# Patient Record
Sex: Female | Born: 1940
Health system: Southern US, Community
[De-identification: ages and names within clinical notes are randomized; demographics above are authoritative.]

## PROBLEM LIST (undated history)

## (undated) DIAGNOSIS — M1990 Unspecified osteoarthritis, unspecified site: Secondary | ICD-10-CM

## (undated) DIAGNOSIS — Z5189 Encounter for other specified aftercare: Secondary | ICD-10-CM

## (undated) DIAGNOSIS — E785 Hyperlipidemia, unspecified: Secondary | ICD-10-CM

## (undated) DIAGNOSIS — I10 Essential (primary) hypertension: Secondary | ICD-10-CM

## (undated) DIAGNOSIS — K219 Gastro-esophageal reflux disease without esophagitis: Secondary | ICD-10-CM

## (undated) DIAGNOSIS — E079 Disorder of thyroid, unspecified: Secondary | ICD-10-CM

## (undated) DIAGNOSIS — J189 Pneumonia, unspecified organism: Secondary | ICD-10-CM

## (undated) HISTORY — PX: LYMPH NODE DISSECTION: SHX5087

## (undated) HISTORY — PX: APPENDECTOMY: SHX54

## (undated) HISTORY — DX: Encounter for other specified aftercare: Z51.89

## (undated) HISTORY — PX: TONSILLECTOMY: SUR1361

## (undated) HISTORY — DX: Hyperlipidemia, unspecified: E78.5

## (undated) HISTORY — PX: CATARACT EXTRACTION, BILATERAL: SHX1313

## (undated) HISTORY — PX: OTHER SURGICAL HISTORY: SHX169

## (undated) HISTORY — DX: Unspecified osteoarthritis, unspecified site: M19.90

## (undated) HISTORY — DX: Disorder of thyroid, unspecified: E07.9

## (undated) HISTORY — PX: DILATION AND CURETTAGE OF UTERUS: SHX78

## (undated) HISTORY — DX: Essential (primary) hypertension: I10

## (undated) HISTORY — PX: ABDOMINAL HYSTERECTOMY: SUR658

## (undated) HISTORY — PX: CHOLECYSTECTOMY OPEN: SUR202

## (undated) HISTORY — DX: Gastro-esophageal reflux disease without esophagitis: K21.9

## (undated) HISTORY — PX: COLONOSCOPY: SHX174

---
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
## 2001-04-18 ENCOUNTER — Encounter: Admission: RE | Admit: 2001-04-18 | Discharge: 2001-04-18 | Payer: Self-pay | Admitting: Internal Medicine

## 2001-04-18 ENCOUNTER — Encounter: Payer: Self-pay | Admitting: Internal Medicine

## 2001-05-10 ENCOUNTER — Other Ambulatory Visit: Admission: RE | Admit: 2001-05-10 | Discharge: 2001-05-10 | Payer: Self-pay | Admitting: Internal Medicine

## 2003-09-18 ENCOUNTER — Encounter: Admission: RE | Admit: 2003-09-18 | Discharge: 2003-09-18 | Payer: Self-pay | Admitting: Internal Medicine

## 2003-09-18 ENCOUNTER — Encounter: Payer: Self-pay | Admitting: Internal Medicine

## 2005-10-20 ENCOUNTER — Ambulatory Visit (HOSPITAL_COMMUNITY): Admission: RE | Admit: 2005-10-20 | Discharge: 2005-10-20 | Payer: Self-pay | Admitting: Internal Medicine

## 2006-09-15 ENCOUNTER — Encounter: Admission: RE | Admit: 2006-09-15 | Discharge: 2006-09-15 | Payer: Self-pay | Admitting: Internal Medicine

## 2006-10-07 ENCOUNTER — Encounter: Admission: RE | Admit: 2006-10-07 | Discharge: 2006-10-07 | Payer: Self-pay | Admitting: Internal Medicine

## 2007-09-21 ENCOUNTER — Encounter: Admission: RE | Admit: 2007-09-21 | Discharge: 2007-09-21 | Payer: Self-pay | Admitting: Internal Medicine

## 2009-10-13 ENCOUNTER — Encounter: Admission: RE | Admit: 2009-10-13 | Discharge: 2009-10-13 | Payer: Self-pay | Admitting: Internal Medicine

## 2009-10-14 ENCOUNTER — Encounter: Admission: RE | Admit: 2009-10-14 | Discharge: 2009-10-14 | Payer: Self-pay | Admitting: Internal Medicine

## 2009-11-13 ENCOUNTER — Encounter (INDEPENDENT_AMBULATORY_CARE_PROVIDER_SITE_OTHER): Payer: Self-pay | Admitting: *Deleted

## 2009-11-14 ENCOUNTER — Ambulatory Visit: Payer: Self-pay | Admitting: Gastroenterology

## 2009-11-28 ENCOUNTER — Ambulatory Visit: Payer: Self-pay | Admitting: Gastroenterology

## 2009-12-04 ENCOUNTER — Encounter: Payer: Self-pay | Admitting: Gastroenterology

## 2010-11-06 ENCOUNTER — Encounter: Admission: RE | Admit: 2010-11-06 | Discharge: 2010-11-06 | Payer: Self-pay | Admitting: Internal Medicine

## 2012-10-23 ENCOUNTER — Other Ambulatory Visit: Payer: Self-pay | Admitting: Internal Medicine

## 2012-10-23 DIAGNOSIS — Z1231 Encounter for screening mammogram for malignant neoplasm of breast: Secondary | ICD-10-CM

## 2012-10-31 ENCOUNTER — Ambulatory Visit
Admission: RE | Admit: 2012-10-31 | Discharge: 2012-10-31 | Disposition: A | Discharge status: Home / Self Care | Payer: BC Managed Care – PPO | Source: Ambulatory Visit | Attending: Internal Medicine | Admitting: Internal Medicine

## 2012-10-31 DIAGNOSIS — Z1231 Encounter for screening mammogram for malignant neoplasm of breast: Secondary | ICD-10-CM

## 2012-11-02 ENCOUNTER — Other Ambulatory Visit: Payer: Self-pay | Admitting: Internal Medicine

## 2012-11-02 DIAGNOSIS — R928 Other abnormal and inconclusive findings on diagnostic imaging of breast: Secondary | ICD-10-CM

## 2012-11-15 ENCOUNTER — Ambulatory Visit
Admission: RE | Admit: 2012-11-15 | Discharge: 2012-11-15 | Disposition: A | Discharge status: Home / Self Care | Payer: BC Managed Care – PPO | Source: Ambulatory Visit | Attending: Internal Medicine | Admitting: Internal Medicine

## 2012-11-15 ENCOUNTER — Other Ambulatory Visit: Payer: Self-pay | Admitting: Internal Medicine

## 2012-11-15 DIAGNOSIS — R928 Other abnormal and inconclusive findings on diagnostic imaging of breast: Secondary | ICD-10-CM

## 2012-11-15 DIAGNOSIS — R2230 Localized swelling, mass and lump, unspecified upper limb: Secondary | ICD-10-CM

## 2013-11-02 ENCOUNTER — Other Ambulatory Visit: Payer: Self-pay

## 2013-11-02 DIAGNOSIS — Z1231 Encounter for screening mammogram for malignant neoplasm of breast: Secondary | ICD-10-CM

## 2013-11-23 ENCOUNTER — Ambulatory Visit: Payer: BC Managed Care – PPO

## 2013-12-03 ENCOUNTER — Ambulatory Visit
Admission: RE | Admit: 2013-12-03 | Discharge: 2013-12-03 | Disposition: A | Discharge status: Home / Self Care | Payer: BC Managed Care – PPO | Source: Ambulatory Visit

## 2013-12-03 DIAGNOSIS — Z1231 Encounter for screening mammogram for malignant neoplasm of breast: Secondary | ICD-10-CM

## 2013-12-04 ENCOUNTER — Other Ambulatory Visit: Payer: Self-pay | Admitting: Internal Medicine

## 2013-12-04 DIAGNOSIS — R928 Other abnormal and inconclusive findings on diagnostic imaging of breast: Secondary | ICD-10-CM

## 2013-12-28 ENCOUNTER — Other Ambulatory Visit: Payer: Self-pay | Admitting: Internal Medicine

## 2013-12-28 ENCOUNTER — Ambulatory Visit
Admission: RE | Admit: 2013-12-28 | Discharge: 2013-12-28 | Disposition: A | Discharge status: Home / Self Care | Payer: BC Managed Care – PPO | Source: Ambulatory Visit | Attending: Internal Medicine | Admitting: Internal Medicine

## 2013-12-28 DIAGNOSIS — R928 Other abnormal and inconclusive findings on diagnostic imaging of breast: Secondary | ICD-10-CM

## 2014-01-01 ENCOUNTER — Other Ambulatory Visit: Payer: Self-pay | Admitting: Internal Medicine

## 2014-09-27 ENCOUNTER — Encounter: Payer: Self-pay | Admitting: Gastroenterology

## 2014-10-25 ENCOUNTER — Encounter: Payer: Self-pay | Admitting: Gastroenterology

## 2015-01-10 ENCOUNTER — Ambulatory Visit (AMBULATORY_SURGERY_CENTER): Payer: Self-pay

## 2015-01-10 VITALS — Ht 68.5 in | Wt 229.0 lb

## 2015-01-10 DIAGNOSIS — Z860100 Personal history of colon polyps, unspecified: Secondary | ICD-10-CM

## 2015-01-10 MED ORDER — SUPREP BOWEL PREP KIT 17.5-3.13-1.6 GM/177ML PO SOLN: 1.0000 | Freq: Once | ORAL | Status: DC

## 2015-01-10 NOTE — Progress Notes (Signed)
No allergies to eggs or soy No past problems with anesthesia No home oxygen No diet/weight loss meds  No email 

## 2015-01-14 ENCOUNTER — Other Ambulatory Visit: Payer: Self-pay

## 2015-01-14 DIAGNOSIS — Z1231 Encounter for screening mammogram for malignant neoplasm of breast: Secondary | ICD-10-CM

## 2015-01-21 ENCOUNTER — Ambulatory Visit
Admission: RE | Admit: 2015-01-21 | Discharge: 2015-01-21 | Disposition: A | Discharge status: Home / Self Care | Payer: BLUE CROSS/BLUE SHIELD | Source: Ambulatory Visit

## 2015-01-21 DIAGNOSIS — Z1231 Encounter for screening mammogram for malignant neoplasm of breast: Secondary | ICD-10-CM

## 2015-01-22 ENCOUNTER — Other Ambulatory Visit: Payer: Self-pay | Admitting: Internal Medicine

## 2015-01-22 DIAGNOSIS — R928 Other abnormal and inconclusive findings on diagnostic imaging of breast: Secondary | ICD-10-CM

## 2015-01-24 ENCOUNTER — Encounter: Payer: Self-pay | Admitting: Gastroenterology

## 2015-01-24 ENCOUNTER — Ambulatory Visit (AMBULATORY_SURGERY_CENTER): Payer: BLUE CROSS/BLUE SHIELD | Admitting: Gastroenterology

## 2015-01-24 VITALS — BP 146/93 | HR 63 | Temp 96.8°F | Resp 13 | Ht 68.5 in | Wt 229.0 lb

## 2015-01-24 DIAGNOSIS — Z800 Family history of malignant neoplasm of digestive organs: Secondary | ICD-10-CM

## 2015-01-24 DIAGNOSIS — D124 Benign neoplasm of descending colon: Secondary | ICD-10-CM

## 2015-01-24 DIAGNOSIS — D123 Benign neoplasm of transverse colon: Secondary | ICD-10-CM

## 2015-01-24 DIAGNOSIS — Z86010 Personal history of colonic polyps: Secondary | ICD-10-CM

## 2015-01-24 DIAGNOSIS — K5730 Diverticulosis of large intestine without perforation or abscess without bleeding: Secondary | ICD-10-CM

## 2015-01-24 DIAGNOSIS — D125 Benign neoplasm of sigmoid colon: Secondary | ICD-10-CM

## 2015-01-24 MED ORDER — SODIUM CHLORIDE 0.9 % IV SOLN: 500.0000 mL | INTRAVENOUS | Status: DC

## 2015-01-24 NOTE — Progress Notes (Signed)
Report to PACU, RN, vss, BBS= Clear.  

## 2015-01-24 NOTE — Op Note (Signed)
[facility]  [address] [place], [id number]   COLONOSCOPY PROCEDURE REPORT  PATIENT: Vanessa Wheeler, Vanessa Wheeler  MR#: [id number] BIRTHDATE: 02-01-1941 , 32  yrs. old GENDER: female ENDOSCOPIST: Inda Castle, MD REFERRED OI:[facility] Nyoka Cowden, M.D. PROCEDURE DATE:  01/24/2015 PROCEDURE:   Colonoscopy with cold biopsy polypectomy First Screening Colonoscopy - Avg.  risk and is 50 yrs.  old or older - No.  Prior Negative Screening - Now for repeat screening. N/A  History of Adenoma - Now for follow-up colonoscopy & has been > or = to 3 yrs.  Yes hx of adenoma.  Has been 3 or more years since last colonoscopy.  Polyps Removed Today? Yes. ASA CLASS:   Class II INDICATIONS:high risk personal history of colonic polyps 2010 and patient's immediate family history of colon cancer. MEDICATIONS: Propofol 300 mg IV  DESCRIPTION OF PROCEDURE:   After the risks benefits and alternatives of the procedure were thoroughly explained, informed consent was obtained.  The digital rectal exam revealed no abnormalities of the rectum.   The [id number]  endoscope was introduced through the anus and advanced to the cecum, which was identified by both the appendix and ileocecal valve. No adverse events experienced.   The quality of the prep was excellent using Suprep  The instrument was then slowly withdrawn as the colon was fully examined.      COLON FINDINGS: A sessile polyp measuring 2 mm in size was found at the splenic flexure.  A polypectomy was performed with cold forceps.   A sessile polyp measuring 2 mm in size was found in the descending colon.  A polypectomy was performed with cold forceps. There was mild diverticulosis noted in the sigmoid colon. Retroflexed views revealed no abnormalities. The time to cecum=2 minutes 14 seconds.  Withdrawal time=15 minutes 33 seconds.  The scope was withdrawn and the procedure completed. COMPLICATIONS: There were no immediate  complications.  ENDOSCOPIC IMPRESSION: 1.   Sessile polyp was found at the splenic flexure; polypectomy was performed with cold forceps 2.   Sessile polyp was found in the descending colon; polypectomy was performed with cold forceps 3.   Mild diverticulosis was noted in the sigmoid colon  RECOMMENDATIONS: Given your significant family history of colon cancer, you should have a repeat colonoscopy in 5 years  eSigned:  Inda Castle, MD 01/24/2015 9:35 AM   cc:   PATIENT NAME:  Kayloni, Rocco MR#: [id number]

## 2015-01-24 NOTE — Patient Instructions (Signed)
Impressions/recommendations:  Polyp (handout given) Diverticulosis (handout given) High Fiber diet (handout given)  Repeat colonoscopy in 5 years.  YOU HAD AN ENDOSCOPIC PROCEDURE TODAY AT [facility]: Refer to the procedure report that was given to you for any specific questions about what was found during the examination.  If the procedure report does not answer your questions, please call your gastroenterologist to clarify.  If you requested that your care partner not be given the details of your procedure findings, then the procedure report has been included in a sealed envelope for you to review at your convenience later.  YOU SHOULD EXPECT: Some feelings of bloating in the abdomen. Passage of more gas than usual.  Walking can help get rid of the air that was put into your GI tract during the procedure and reduce the bloating. If you had a lower endoscopy (such as a colonoscopy or flexible sigmoidoscopy) you may notice spotting of blood in your stool or on the toilet paper. If you underwent a bowel prep for your procedure, then you may not have a normal bowel movement for a few days.  DIET: Your first meal following the procedure should be a light meal and then it is ok to progress to your normal diet.  A half-sandwich or bowl of soup is an example of a good first meal.  Heavy or fried foods are harder to digest and may make you feel nauseous or bloated.  Likewise meals heavy in dairy and vegetables can cause extra gas to form and this can also increase the bloating.  Drink plenty of fluids but you should avoid alcoholic beverages for 24 hours.  ACTIVITY: Your care partner should take you home directly after the procedure.  You should plan to take it easy, moving slowly for the rest of the day.  You can resume normal activity the day after the procedure however you should NOT DRIVE or use heavy machinery for 24 hours (because of the sedation medicines used during the test).     SYMPTOMS TO REPORT IMMEDIATELY: A gastroenterologist can be reached at any hour.  During normal business hours, 8:30 AM to 5:00 PM Monday through Friday, call [phone].  After hours and on weekends, please call the GI answering service at [phone] who will take a message and have the physician on call contact you.   Following lower endoscopy (colonoscopy or flexible sigmoidoscopy):  Excessive amounts of blood in the stool  Significant tenderness or worsening of abdominal pains  Swelling of the abdomen that is new, acute  Fever of 100F or higher  FOLLOW UP: If any biopsies were taken you will be contacted by phone or by letter within the next 1-3 weeks.  Call your gastroenterologist if you have not heard about the biopsies in 3 weeks.  Our staff will call the home number listed on your records the next business day following your procedure to check on you and address any questions or concerns that you may have at that time regarding the information given to you following your procedure. This is a courtesy call and so if there is no answer at the home number and we have not heard from you through the emergency physician on call, we will assume that you have returned to your regular daily activities without incident.  SIGNATURES/CONFIDENTIALITY: You and/or your care partner have signed paperwork which will be entered into your electronic medical record.  These signatures attest to the fact that that the information  above on your After Visit Summary has been reviewed and is understood.  Full responsibility of the confidentiality of this discharge information lies with you and/or your care-partner. 

## 2015-01-24 NOTE — Progress Notes (Signed)
Called to room to assist during endoscopic procedure.  Patient ID and intended procedure confirmed with present staff. Received instructions for my participation in the procedure from the performing physician.  

## 2015-01-27 ENCOUNTER — Telehealth: Payer: Self-pay | Admitting: *Deleted

## 2015-01-27 ENCOUNTER — Other Ambulatory Visit: Payer: BLUE CROSS/BLUE SHIELD

## 2015-01-27 NOTE — Telephone Encounter (Signed)
  Follow up Call-  Call back number 01/24/2015  Post procedure Call Back phone  # [phone]  Permission to leave phone message Yes     Patient questions:  Do you have a fever, pain , or abdominal swelling? No. Pain Score  0 *  Have you tolerated food without any problems? Yes.    Have you been able to return to your normal activities? Yes.    Do you have any questions about your discharge instructions: Diet   No. Medications  No. Follow up visit  No.  Do you have questions or concerns about your Care? No.  Actions: * If pain score is 4 or above: No action needed, pain <4.

## 2015-01-29 ENCOUNTER — Other Ambulatory Visit: Payer: Self-pay | Admitting: Internal Medicine

## 2015-01-29 ENCOUNTER — Ambulatory Visit
Admission: RE | Admit: 2015-01-29 | Discharge: 2015-01-29 | Disposition: A | Discharge status: Home / Self Care | Payer: BLUE CROSS/BLUE SHIELD | Source: Ambulatory Visit | Attending: Internal Medicine | Admitting: Internal Medicine

## 2015-01-29 ENCOUNTER — Encounter: Payer: Self-pay | Admitting: Gastroenterology

## 2015-01-29 DIAGNOSIS — N6310 Unspecified lump in the right breast, unspecified quadrant: Secondary | ICD-10-CM

## 2015-01-29 DIAGNOSIS — R928 Other abnormal and inconclusive findings on diagnostic imaging of breast: Secondary | ICD-10-CM

## 2015-02-13 ENCOUNTER — Ambulatory Visit
Admission: RE | Admit: 2015-02-13 | Discharge: 2015-02-13 | Disposition: A | Discharge status: Home / Self Care | Payer: BLUE CROSS/BLUE SHIELD | Source: Ambulatory Visit | Attending: Internal Medicine | Admitting: Internal Medicine

## 2015-02-13 DIAGNOSIS — N6310 Unspecified lump in the right breast, unspecified quadrant: Secondary | ICD-10-CM

## 2015-08-25 ENCOUNTER — Encounter: Payer: Self-pay | Admitting: Gastroenterology

## 2016-01-21 ENCOUNTER — Other Ambulatory Visit: Payer: Self-pay

## 2016-01-21 DIAGNOSIS — Z1231 Encounter for screening mammogram for malignant neoplasm of breast: Secondary | ICD-10-CM

## 2016-01-26 ENCOUNTER — Ambulatory Visit
Admission: RE | Admit: 2016-01-26 | Discharge: 2016-01-26 | Disposition: A | Discharge status: Home / Self Care | Payer: BLUE CROSS/BLUE SHIELD | Source: Ambulatory Visit

## 2016-01-26 DIAGNOSIS — Z1231 Encounter for screening mammogram for malignant neoplasm of breast: Secondary | ICD-10-CM

## 2018-01-30 ENCOUNTER — Other Ambulatory Visit: Payer: Self-pay | Admitting: Internal Medicine

## 2018-01-30 DIAGNOSIS — Z1231 Encounter for screening mammogram for malignant neoplasm of breast: Secondary | ICD-10-CM

## 2018-02-20 ENCOUNTER — Ambulatory Visit
Admission: RE | Admit: 2018-02-20 | Discharge: 2018-02-20 | Disposition: A | Discharge status: Home / Self Care | Payer: Medicare Other | Source: Ambulatory Visit | Attending: Internal Medicine | Admitting: Internal Medicine

## 2018-02-20 DIAGNOSIS — Z1231 Encounter for screening mammogram for malignant neoplasm of breast: Secondary | ICD-10-CM

## 2018-02-21 ENCOUNTER — Other Ambulatory Visit: Payer: Self-pay | Admitting: Internal Medicine

## 2018-02-21 DIAGNOSIS — R928 Other abnormal and inconclusive findings on diagnostic imaging of breast: Secondary | ICD-10-CM

## 2018-02-24 ENCOUNTER — Ambulatory Visit
Admission: RE | Admit: 2018-02-24 | Discharge: 2018-02-24 | Disposition: A | Discharge status: Home / Self Care | Payer: Medicare Other | Source: Ambulatory Visit | Attending: Internal Medicine | Admitting: Internal Medicine

## 2018-02-24 ENCOUNTER — Other Ambulatory Visit: Payer: Self-pay | Admitting: Internal Medicine

## 2018-02-24 DIAGNOSIS — R928 Other abnormal and inconclusive findings on diagnostic imaging of breast: Secondary | ICD-10-CM

## 2018-03-07 ENCOUNTER — Other Ambulatory Visit: Payer: Self-pay | Admitting: Surgery

## 2018-03-21 ENCOUNTER — Other Ambulatory Visit (HOSPITAL_COMMUNITY)
Admission: RE | Admit: 2018-03-21 | Discharge: 2018-03-21 | Disposition: A | Discharge status: Home / Self Care | Payer: Medicare Other | Source: Ambulatory Visit | Attending: Surgery | Admitting: Surgery

## 2018-03-21 ENCOUNTER — Other Ambulatory Visit: Payer: Self-pay | Admitting: Surgery

## 2018-03-21 DIAGNOSIS — R590 Localized enlarged lymph nodes: Secondary | ICD-10-CM | POA: Diagnosis present

## 2018-03-29 ENCOUNTER — Other Ambulatory Visit: Payer: Self-pay | Admitting: Internal Medicine

## 2018-03-29 DIAGNOSIS — E049 Nontoxic goiter, unspecified: Secondary | ICD-10-CM

## 2018-03-30 ENCOUNTER — Ambulatory Visit
Admission: RE | Admit: 2018-03-30 | Discharge: 2018-03-30 | Disposition: A | Discharge status: Home / Self Care | Payer: Medicare Other | Source: Ambulatory Visit | Attending: Internal Medicine | Admitting: Internal Medicine

## 2018-03-30 DIAGNOSIS — E049 Nontoxic goiter, unspecified: Secondary | ICD-10-CM

## 2018-03-31 ENCOUNTER — Other Ambulatory Visit (HOSPITAL_COMMUNITY): Payer: Self-pay | Admitting: Internal Medicine

## 2018-03-31 DIAGNOSIS — E049 Nontoxic goiter, unspecified: Secondary | ICD-10-CM

## 2018-04-17 ENCOUNTER — Ambulatory Visit (HOSPITAL_COMMUNITY): Payer: Medicare Other

## 2018-04-18 ENCOUNTER — Other Ambulatory Visit (HOSPITAL_COMMUNITY): Payer: Medicare Other

## 2018-04-25 DIAGNOSIS — E041 Nontoxic single thyroid nodule: Secondary | ICD-10-CM | POA: Insufficient documentation

## 2018-04-25 DIAGNOSIS — E049 Nontoxic goiter, unspecified: Secondary | ICD-10-CM | POA: Diagnosis present

## 2018-04-25 MED ORDER — SODIUM IODIDE I 131 CAPSULE
15.0000 | Freq: Once | INTRAVENOUS | Status: AC | PRN
  Administered 2018-04-25: 17 via ORAL

## 2018-04-25 MED ORDER — SODIUM PERTECHNETATE TC 99M INJECTION: 15.0000 | Freq: Once | INTRAVENOUS | Status: DC | PRN

## 2018-04-26 ENCOUNTER — Ambulatory Visit (HOSPITAL_COMMUNITY)
Admission: RE | Admit: 2018-04-26 | Discharge: 2018-04-26 | Disposition: A | Discharge status: Home / Self Care | Payer: Medicare Other | Source: Ambulatory Visit | Attending: Internal Medicine | Admitting: Internal Medicine

## 2018-04-26 DIAGNOSIS — E049 Nontoxic goiter, unspecified: Secondary | ICD-10-CM | POA: Diagnosis not present

## 2018-04-26 DIAGNOSIS — E0590 Thyrotoxicosis, unspecified without thyrotoxic crisis or storm: Secondary | ICD-10-CM

## 2018-04-26 MED ORDER — SODIUM PERTECHNETATE TC 99M INJECTION
10.0000 | Freq: Once | INTRAVENOUS | Status: AC | PRN
  Administered 2018-04-26: 10 via INTRAVENOUS

## 2018-08-14 ENCOUNTER — Other Ambulatory Visit: Payer: Self-pay | Admitting: Internal Medicine

## 2018-08-14 DIAGNOSIS — E041 Nontoxic single thyroid nodule: Secondary | ICD-10-CM

## 2018-08-21 ENCOUNTER — Ambulatory Visit
Admission: RE | Admit: 2018-08-21 | Discharge: 2018-08-21 | Disposition: A | Discharge status: Home / Self Care | Payer: Medicare Other | Source: Ambulatory Visit | Attending: Internal Medicine | Admitting: Internal Medicine

## 2018-08-21 DIAGNOSIS — E041 Nontoxic single thyroid nodule: Secondary | ICD-10-CM

## 2019-06-01 ENCOUNTER — Other Ambulatory Visit: Payer: Self-pay | Admitting: Internal Medicine

## 2019-06-01 DIAGNOSIS — E041 Nontoxic single thyroid nodule: Secondary | ICD-10-CM

## 2019-06-13 ENCOUNTER — Ambulatory Visit
Admission: RE | Admit: 2019-06-13 | Discharge: 2019-06-13 | Disposition: A | Discharge status: Home / Self Care | Payer: Medicare Other | Source: Ambulatory Visit | Attending: Internal Medicine | Admitting: Internal Medicine

## 2019-06-13 DIAGNOSIS — E041 Nontoxic single thyroid nodule: Secondary | ICD-10-CM

## 2020-05-01 ENCOUNTER — Encounter: Payer: Self-pay | Admitting: Gastroenterology

## 2020-05-08 ENCOUNTER — Other Ambulatory Visit: Payer: Self-pay | Admitting: Internal Medicine

## 2020-05-08 DIAGNOSIS — Z1231 Encounter for screening mammogram for malignant neoplasm of breast: Secondary | ICD-10-CM

## 2020-05-09 ENCOUNTER — Ambulatory Visit
Admission: RE | Admit: 2020-05-09 | Discharge: 2020-05-09 | Disposition: A | Discharge status: Home / Self Care | Payer: Medicare Other | Source: Ambulatory Visit | Attending: Internal Medicine | Admitting: Internal Medicine

## 2020-05-09 ENCOUNTER — Other Ambulatory Visit: Payer: Self-pay

## 2020-05-09 DIAGNOSIS — Z1231 Encounter for screening mammogram for malignant neoplasm of breast: Secondary | ICD-10-CM

## 2020-05-29 ENCOUNTER — Other Ambulatory Visit: Payer: Self-pay

## 2020-05-29 ENCOUNTER — Ambulatory Visit (AMBULATORY_SURGERY_CENTER): Payer: Self-pay | Admitting: *Deleted

## 2020-05-29 VITALS — Ht 68.5 in | Wt 222.6 lb

## 2020-05-29 DIAGNOSIS — Z86010 Personal history of colonic polyps: Secondary | ICD-10-CM

## 2020-05-29 DIAGNOSIS — Z800 Family history of malignant neoplasm of digestive organs: Secondary | ICD-10-CM

## 2020-05-29 MED ORDER — SUTAB 1479-225-188 MG PO TABS: 1.0000 | ORAL_TABLET | Freq: Once | ORAL | 0 refills | Status: AC

## 2020-05-29 NOTE — Progress Notes (Signed)
2nd dose of covid vaccine 03-07-20  Pt is aware that care partner will wait in the car during procedure; if they feel like they will be too hot or cold to wait in the car; they may wait in the 4 th floor lobby. Patient is aware to bring only one care partner. We want them to wear a mask (we do not have any that we can provide them), practice social distancing, and we will check their temperatures when they get here.  I did remind the patient that their care partner needs to stay in the parking lot the entire time and have a cell phone available, we will call them when the pt is ready for discharge. Patient will wear mask into building.   No trouble with anesthesia, difficulty with intubation or hx/fam hx of malignant hyperthermia per pt   No egg or soy allergy  No home oxygen use   No medications for weight loss taken  emmi information given  Pt denies constipation issues   Sutab code put into RX and paper copy given to pt to show pharmacy

## 2020-06-11 ENCOUNTER — Encounter: Payer: Self-pay | Admitting: Certified Registered Nurse Anesthetist

## 2020-06-12 ENCOUNTER — Other Ambulatory Visit: Payer: Self-pay

## 2020-06-12 ENCOUNTER — Encounter: Payer: Self-pay | Admitting: Gastroenterology

## 2020-06-12 ENCOUNTER — Ambulatory Visit (AMBULATORY_SURGERY_CENTER): Payer: Medicare Other | Admitting: Gastroenterology

## 2020-06-12 VITALS — BP 116/56 | HR 68 | Temp 96.8°F | Resp 19 | Ht 68.0 in | Wt 222.0 lb

## 2020-06-12 DIAGNOSIS — D123 Benign neoplasm of transverse colon: Secondary | ICD-10-CM

## 2020-06-12 DIAGNOSIS — Z86010 Personal history of colonic polyps: Secondary | ICD-10-CM | POA: Diagnosis present

## 2020-06-12 DIAGNOSIS — D120 Benign neoplasm of cecum: Secondary | ICD-10-CM

## 2020-06-12 DIAGNOSIS — D128 Benign neoplasm of rectum: Secondary | ICD-10-CM

## 2020-06-12 DIAGNOSIS — D122 Benign neoplasm of ascending colon: Secondary | ICD-10-CM

## 2020-06-12 MED ORDER — SODIUM CHLORIDE 0.9 % IV SOLN: 500.0000 mL | INTRAVENOUS | Status: DC

## 2020-06-12 NOTE — Progress Notes (Signed)
Report given to PACU, vss 

## 2020-06-12 NOTE — Op Note (Signed)
[facility] Patient Name: Vanessa Wheeler Procedure Date: 06/12/2020 8:57 AM MRN: [id number] Endoscopist: Thornton Park MD, MD Age: 79 Referring MD:  Date of Birth: November 27, 1941 Gender: Female Account #: [id number] Procedure:                Colonoscopy Indications:              Surveillance: Personal history of adenomatous                            polyps on last colonoscopy 5 years ago                           Sister with colon polyps in her 33s                           Colonoscopy in 2004: tubular adenoma                           Colonoscopy in 2010: tubular adenoma                           Colonoscopy in 2016: tubular adenoma x 2 Medicines:                Monitored Anesthesia Care Procedure:                Pre-Anesthesia Assessment:                           - Prior to the procedure, a History and Physical                            was performed, and patient medications and                            allergies were reviewed. The patient's tolerance of                            previous anesthesia was also reviewed. The risks                            and benefits of the procedure and the sedation                            options and risks were discussed with the patient.                            All questions were answered, and informed consent                            was obtained. Prior Anticoagulants: The patient has                            taken no previous anticoagulant or antiplatelet  agents. ASA Grade Assessment: II - A patient with                            mild systemic disease. After reviewing the risks                            and benefits, the patient was deemed in                            satisfactory condition to undergo the procedure.                           After obtaining informed consent, the colonoscope                            was passed under direct vision. Throughout the                             procedure, the patient's blood pressure, pulse, and                            oxygen saturations were monitored continuously. The                            Colonoscope was introduced through the anus and                            advanced to the 3 cm into the ileum. A second                            forward view of the right colon was performed. The                            colonoscopy was performed without difficulty. The                            patient tolerated the procedure well. The quality                            of the bowel preparation was good. The terminal                            ileum, ileocecal valve, appendiceal orifice, and                            rectum were photographed. Scope In: 9:08:13 AM Scope Out: 9:29:20 AM Scope Withdrawal Time: 0 hours 19 minutes 18 seconds  Total Procedure Duration: 0 hours 21 minutes 7 seconds  Findings:                 The perianal and digital rectal examinations were                            normal.  A 2 mm polyp was found in the rectum. The polyp was                            sessile. The polyp was removed with a cold snare.                            Resection and retrieval were complete. Estimated                            blood loss was minimal.                           Three sessile polyps were found in the transverse                            colon. The polyps were 1 to 3 mm in size. These                            polyps were removed with a cold snare. Resection                            and retrieval were complete. Estimated blood loss                            was minimal.                           Three sessile polyps were found in the ascending                            colon. The polyps were 1 to 3 mm in size. These                            polyps were removed with a cold snare. Resection                            and retrieval were complete. Estimated blood loss                             was minimal.                           A less than 1 mm polyp was found in the cecum. The                            polyp was flat. The polyp was removed with a cold                            biopsy forceps. Resection and retrieval were                            complete. Estimated blood loss was minimal.  A few small-mouthed diverticula were found in the                            sigmoid colon.                           The exam was otherwise without abnormality on                            direct and retroflexion views. Complications:            No immediate complications. Estimated blood loss:                            Minimal. Estimated Blood Loss:     Estimated blood loss was minimal. Impression:               - One 2 mm polyp in the rectum, removed with a cold                            snare. Resected and retrieved.                           - Three 1 to 3 mm polyps in the transverse colon,                            removed with a cold snare. Resected and retrieved.                           - Three 1 to 3 mm polyps in the ascending colon,                            removed with a cold snare. Resected and retrieved.                           - One less than 1 mm polyp in the cecum, removed                            with a cold biopsy forceps. Resected and retrieved.                           - Diverticulosis in the sigmoid colon.                           - The examination was otherwise normal on direct                            and retroflexion views. Recommendation:           - Patient has a contact number available for                            emergencies. The signs and symptoms of potential  delayed complications were discussed with the                            patient. Return to normal activities tomorrow.                            Written discharge instructions were provided to the                             patient.                           - Follow a high fiber diet. Drink at least 64                            ounces of water daily. Add a daily stool bulking                            agent such as psyllium (an exampled would be                            Metamucil).                           - Continue present medications.                           - Await pathology results.                           - Repeat colonoscopy date to be determined after                            pending pathology results are reviewed for                            surveillance.                           - Emerging evidence supports eating a diet of                            fruits, vegetables, grains, calcium, and yogurt                            while reducing red meat and alcohol may reduce the                            risk of colon cancer.                           - Thank you for allowing me to be involved in your                            colon cancer prevention. Thornton Park MD, MD 06/12/2020 9:40:36 AM  This report has been signed electronically.

## 2020-06-12 NOTE — Patient Instructions (Signed)
Handout on polyps, high fiber diet.  Drink at least 64 ounces of water daily. Add a daily stool bulking agent such as psyllium ( example Metamucil).      YOU HAD AN ENDOSCOPIC PROCEDURE TODAY AT [facility]:   Refer to the procedure report that was given to you for any specific questions about what was found during the examination.  If the procedure report does not answer your questions, please call your gastroenterologist to clarify.  If you requested that your care partner not be given the details of your procedure findings, then the procedure report has been included in a sealed envelope for you to review at your convenience later.  YOU SHOULD EXPECT: Some feelings of bloating in the abdomen. Passage of more gas than usual.  Walking can help get rid of the air that was put into your GI tract during the procedure and reduce the bloating. If you had a lower endoscopy (such as a colonoscopy or flexible sigmoidoscopy) you may notice spotting of blood in your stool or on the toilet paper. If you underwent a bowel prep for your procedure, you may not have a normal bowel movement for a few days.  Please Note:  You might notice some irritation and congestion in your nose or some drainage.  This is from the oxygen used during your procedure.  There is no need for concern and it should clear up in a day or so.  SYMPTOMS TO REPORT IMMEDIATELY:   Following lower endoscopy (colonoscopy or flexible sigmoidoscopy):  Excessive amounts of blood in the stool  Significant tenderness or worsening of abdominal pains  Swelling of the abdomen that is new, acute  Fever of 100F or higher   For urgent or emergent issues, a gastroenterologist can be reached at any hour by calling [phone]. Do not use MyChart messaging for urgent concerns.    DIET:  We do recommend a small meal at first, but then you may proceed to your regular diet.  Drink plenty of fluids but you should avoid alcoholic  beverages for 24 hours.  ACTIVITY:  You should plan to take it easy for the rest of today and you should NOT DRIVE or use heavy machinery until tomorrow (because of the sedation medicines used during the test).    FOLLOW UP: Our staff will call the number listed on your records 48-72 hours following your procedure to check on you and address any questions or concerns that you may have regarding the information given to you following your procedure. If we do not reach you, we will leave a message.  We will attempt to reach you two times.  During this call, we will ask if you have developed any symptoms of COVID 19. If you develop any symptoms (ie: fever, flu-like symptoms, shortness of breath, cough etc.) before then, please call [phone].  If you test positive for Covid 19 in the 2 weeks post procedure, please call and report this information to [place].    If any biopsies were taken you will be contacted by phone or by letter within the next 1-3 weeks.  Please call us at [phone] if you have not heard about the biopsies in 3 weeks.    SIGNATURES/CONFIDENTIALITY: You and/or your care partner have signed paperwork which will be entered into your electronic medical record.  These signatures attest to the fact that that the information above on your After Visit Summary has been reviewed and is understood.  Full  responsibility of the confidentiality of this discharge information lies with you and/or your care-partner.

## 2020-06-12 NOTE — Progress Notes (Signed)
Vs CW ° °

## 2020-06-16 ENCOUNTER — Telehealth: Payer: Self-pay | Admitting: *Deleted

## 2020-06-16 NOTE — Telephone Encounter (Signed)
  Follow up Call-  Call back number 06/12/2020  Post procedure Call Back phone  # [phone]  Permission to leave phone message Yes  Some recent data might be hidden     Patient questions:  Do you have a fever, pain , or abdominal swelling? No. Pain Score  0 *  Have you tolerated food without any problems? Yes.    Have you been able to return to your normal activities? Yes.    Do you have any questions about your discharge instructions: Diet   No. Medications  No. Follow up visit  No.  Do you have questions or concerns about your Care? No.  Actions: * If pain score is 4 or above: No action needed, pain <4.  1. Have you developed a fever since your procedure? no  2.   Have you had an respiratory symptoms (SOB or cough) since your procedure? no  3.   Have you tested positive for COVID 19 since your procedure no  4.   Have you had any family members/close contacts diagnosed with the COVID 19 since your procedure?  no   If yes to any of these questions please route to Joylene John, RN and Erenest Rasher, RN

## 2020-06-18 ENCOUNTER — Encounter: Payer: Self-pay | Admitting: Gastroenterology

## 2020-11-22 IMAGING — MG DIGITAL SCREENING BILAT W/ TOMO W/ CAD
6 of 10 series · 6 of 30 positions shown · non-contrast
Comparison: Previous exam(s).

CLINICAL DATA: Screening.

EXAM:
DIGITAL SCREENING BILATERAL MAMMOGRAM WITH TOMO AND CAD

[R MLO synth-2D]
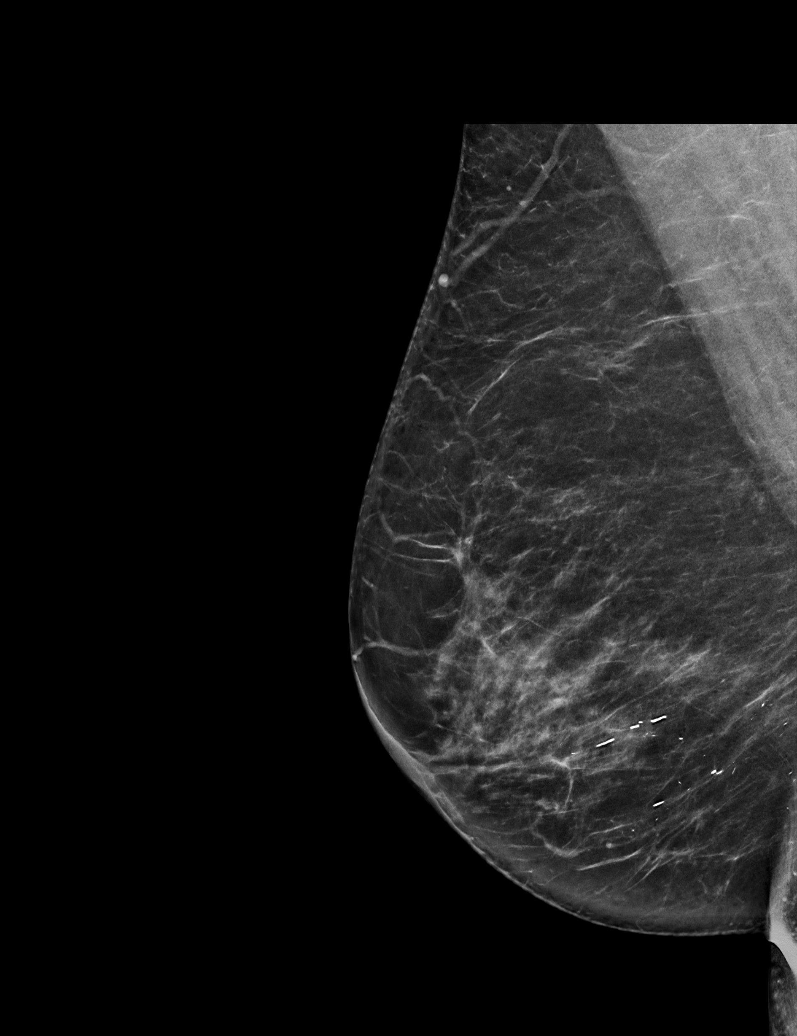

[L CC synth-2D]
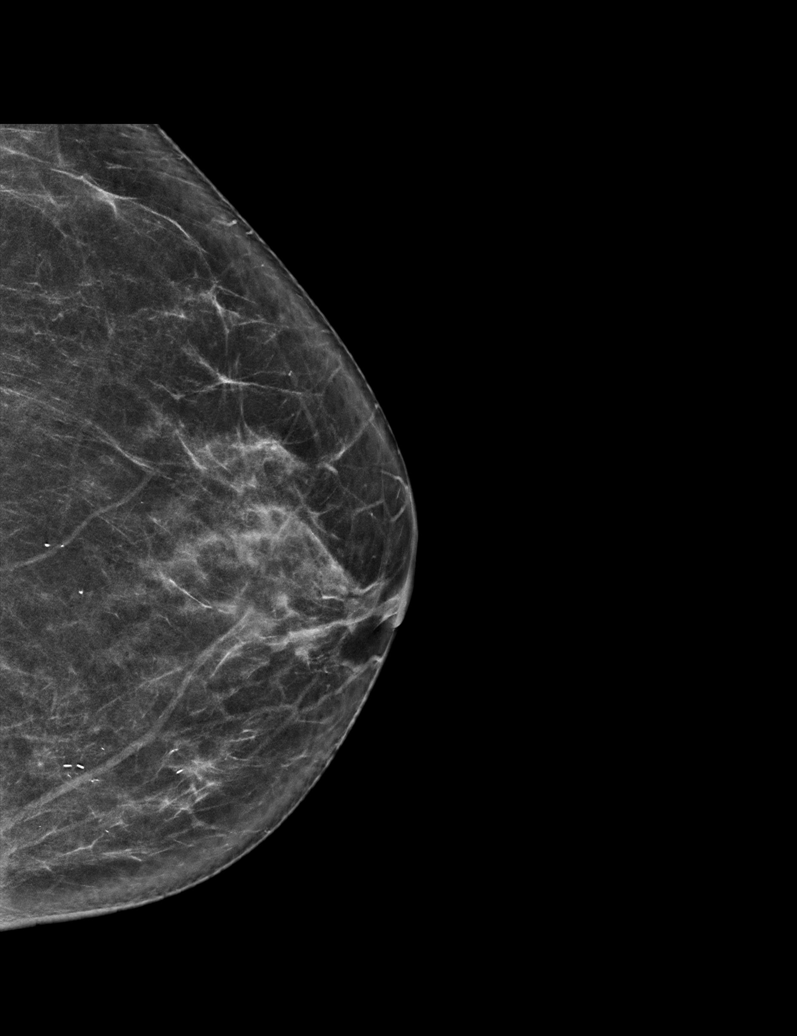

[R CC synth-2D]
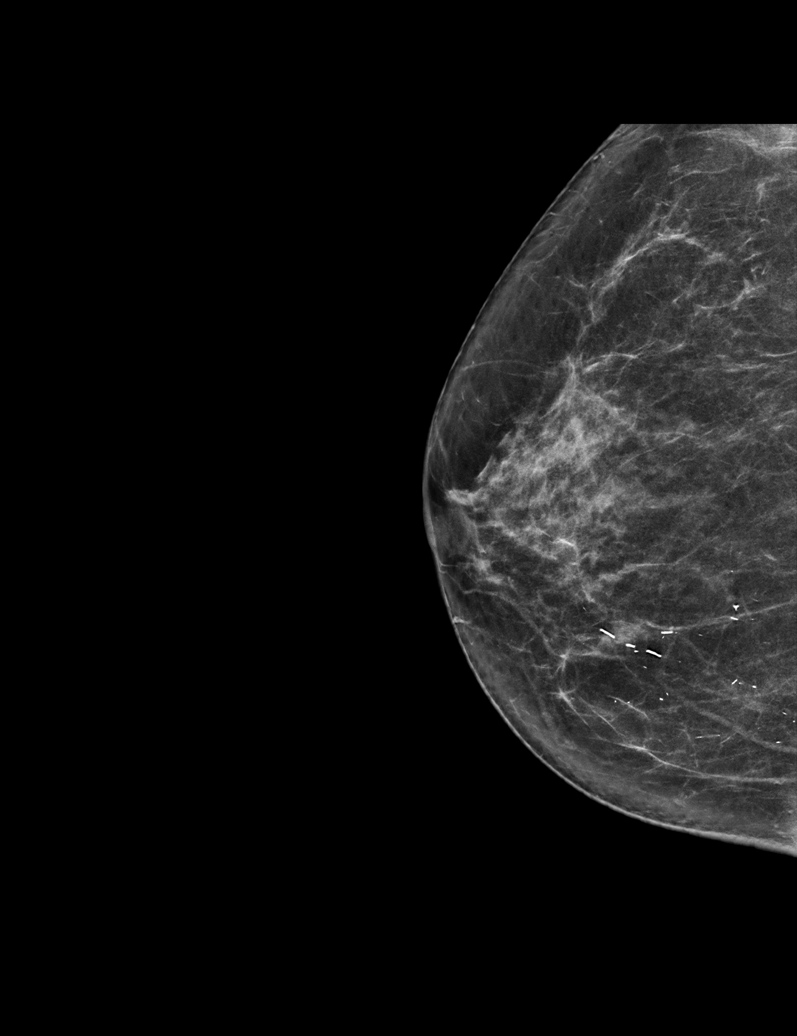

[L MLO synth-2D (1 of 2)]
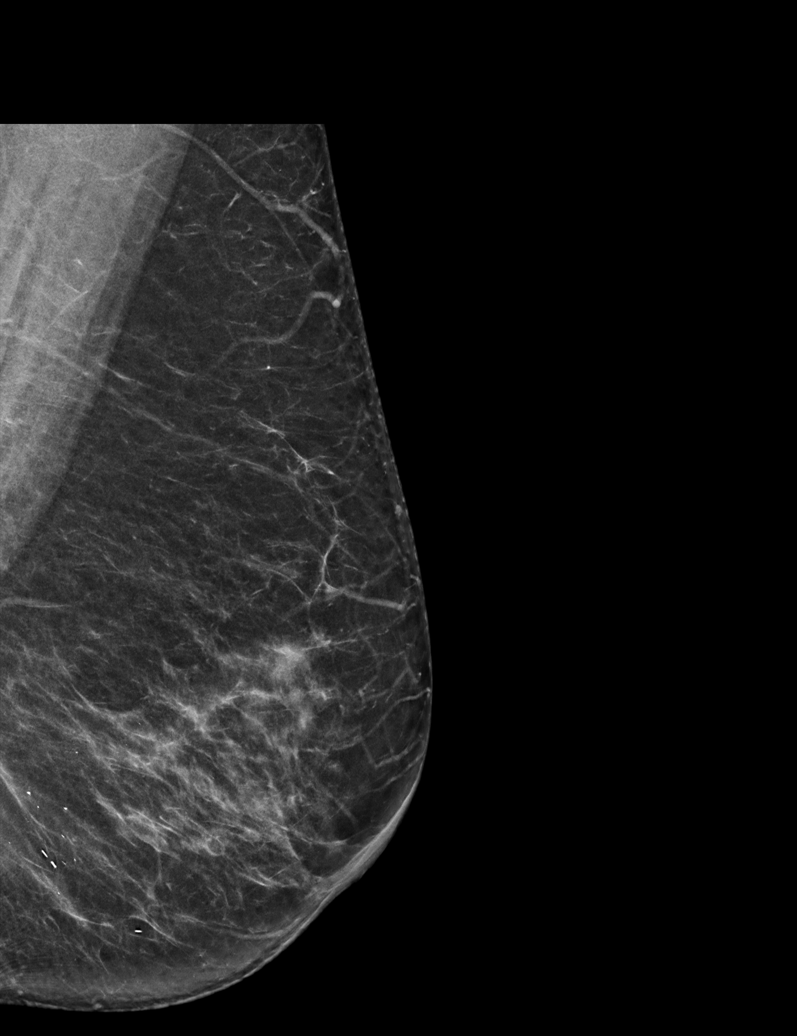

[L MLO synth-2D (2 of 2)]
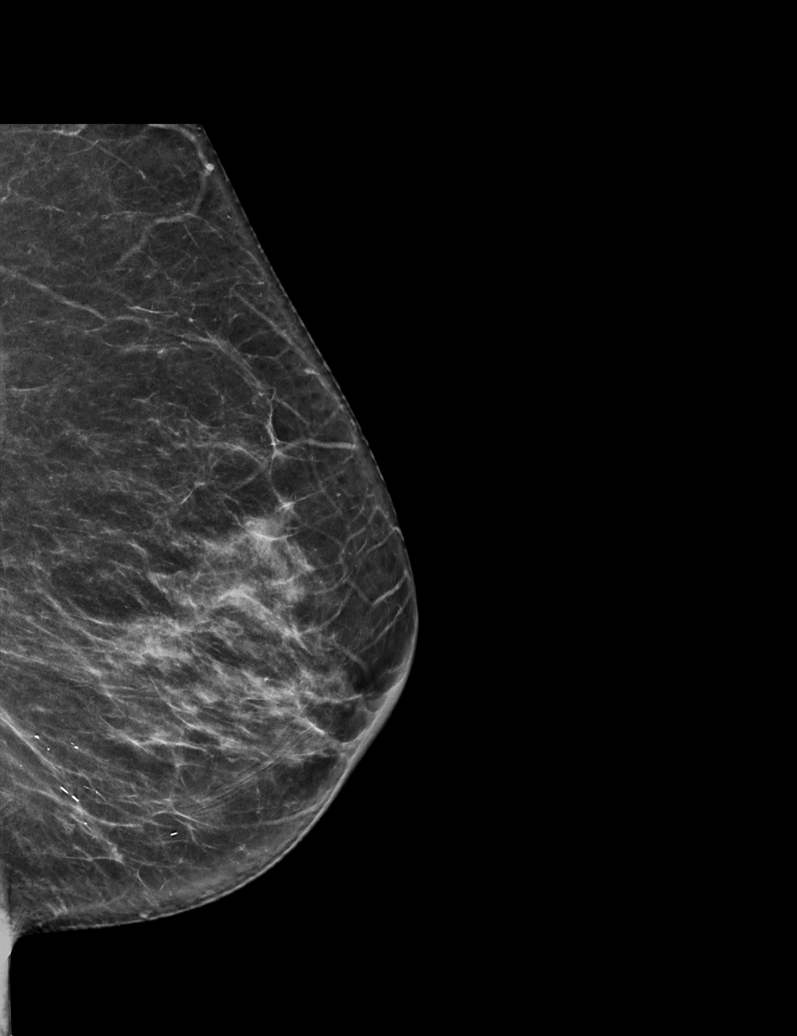

[L MLO tomo · tomo slice 33/66.0]
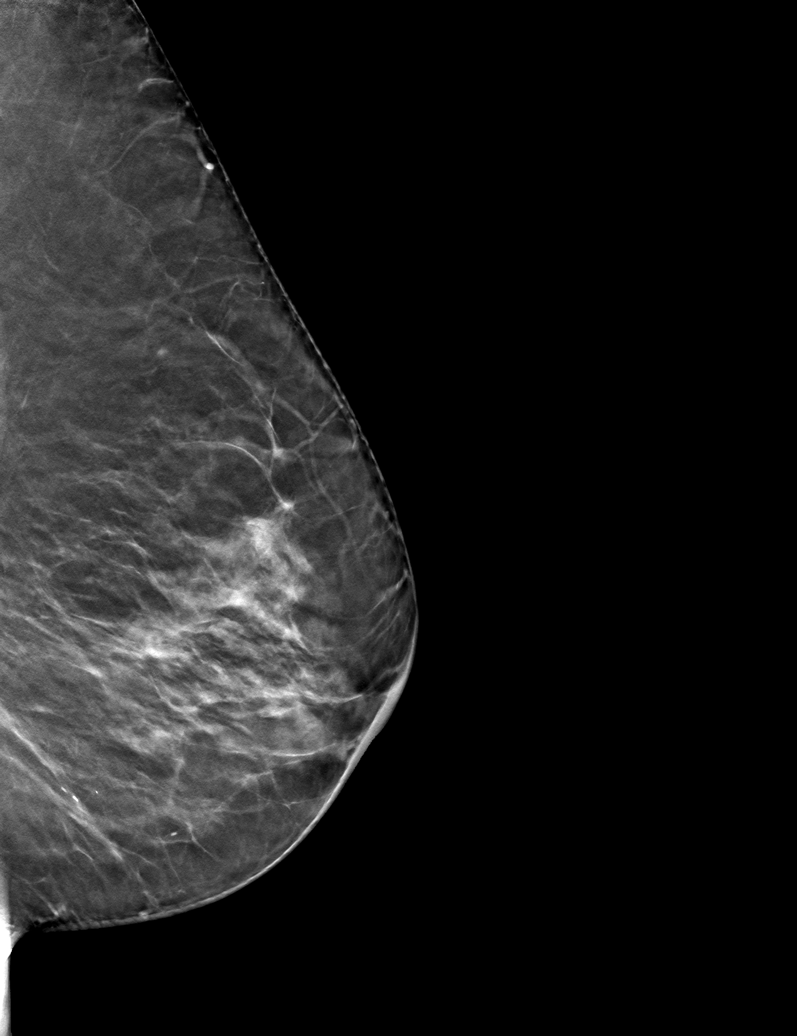

[6 of 30 positions shown; findings below may reference images not displayed]

ACR Breast Density Category b: There are scattered areas of
fibroglandular density.
FINDINGS: There are no findings suspicious for malignancy. Images were
processed with CAD.
IMPRESSION: No mammographic evidence of malignancy. A result letter of this
screening mammogram will be mailed directly to the patient.

RECOMMENDATION:
Screening mammogram in one year. (Code:[id number])

BI-RADS CATEGORY  1: Negative.

## 2021-07-03 ENCOUNTER — Other Ambulatory Visit: Payer: Self-pay | Admitting: Internal Medicine

## 2021-07-03 DIAGNOSIS — E041 Nontoxic single thyroid nodule: Secondary | ICD-10-CM

## 2021-07-07 ENCOUNTER — Ambulatory Visit
Admission: RE | Admit: 2021-07-07 | Discharge: 2021-07-07 | Disposition: A | Discharge status: Home / Self Care | Payer: Medicare Other | Source: Ambulatory Visit | Attending: Internal Medicine | Admitting: Internal Medicine

## 2021-07-07 ENCOUNTER — Other Ambulatory Visit: Payer: Self-pay

## 2021-07-07 DIAGNOSIS — E041 Nontoxic single thyroid nodule: Secondary | ICD-10-CM

## 2021-07-17 ENCOUNTER — Other Ambulatory Visit: Payer: Self-pay | Admitting: Internal Medicine

## 2021-07-17 DIAGNOSIS — E041 Nontoxic single thyroid nodule: Secondary | ICD-10-CM

## 2021-07-29 ENCOUNTER — Ambulatory Visit
Admission: RE | Admit: 2021-07-29 | Discharge: 2021-07-29 | Disposition: A | Discharge status: Home / Self Care | Payer: Medicare Other | Source: Ambulatory Visit | Attending: Internal Medicine | Admitting: Internal Medicine

## 2021-07-29 ENCOUNTER — Other Ambulatory Visit: Payer: Self-pay

## 2021-07-29 ENCOUNTER — Other Ambulatory Visit (HOSPITAL_COMMUNITY)
Admission: RE | Admit: 2021-07-29 | Discharge: 2021-07-29 | Disposition: A | Discharge status: Home / Self Care | Payer: Medicare Other | Source: Ambulatory Visit | Attending: Interventional Radiology | Admitting: Interventional Radiology

## 2021-07-29 DIAGNOSIS — E041 Nontoxic single thyroid nodule: Secondary | ICD-10-CM

## 2021-07-30 LAB — CYTOLOGY - NON PAP

## 2022-02-11 IMAGING — US US FNA BIOPSY THYROID 1ST LESION
1 series · 13 of 13 positions shown · non-contrast
Comparison: Thyroid ultrasound 07/07/2021

MEDICATIONS:
None

COMPLICATIONS:
None immediate.

INDICATION: Indeterminate thyroid nodule

EXAM:
ULTRASOUND GUIDED FINE NEEDLE ASPIRATION OF INDETERMINATE THYROID
NODULE
TECHNIQUE: Informed written consent was obtained from the patient after a
discussion of the risks, benefits and alternatives to treatment.
Questions regarding the procedure were encouraged and answered. A
timeout was performed prior to the initiation of the procedure.

[Series 1: us fna biopsy thyroid 1st lesion · 0.09mm/px · 13 acquisitions, 13 frames shown]
[im 1/13]
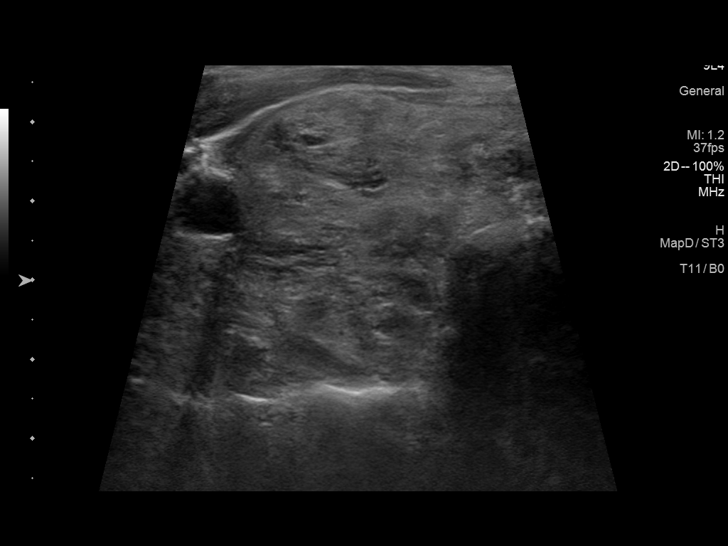
[im 2/13]
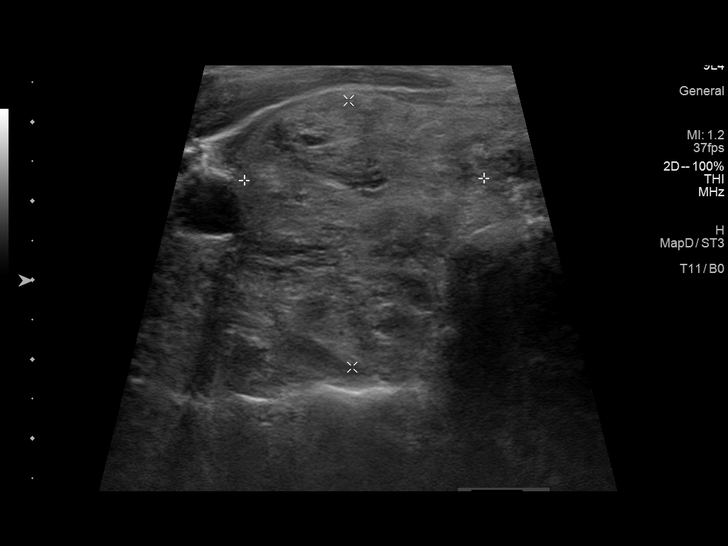
[im 3/13]
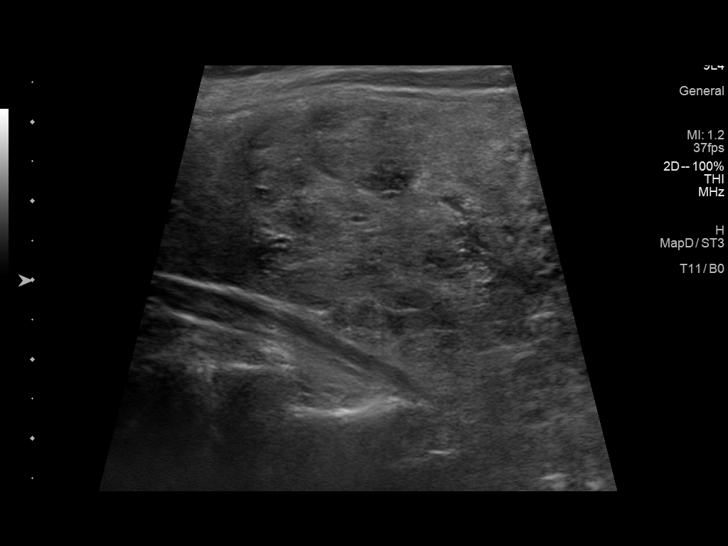
[im 4/13]
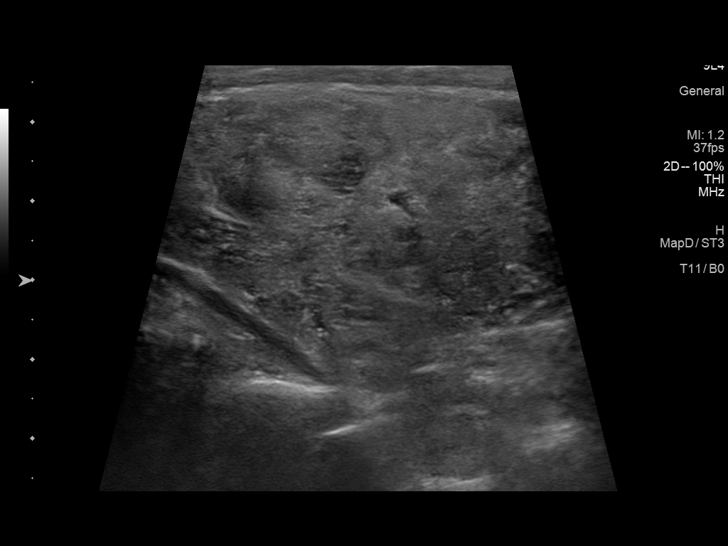
[im 5/13]
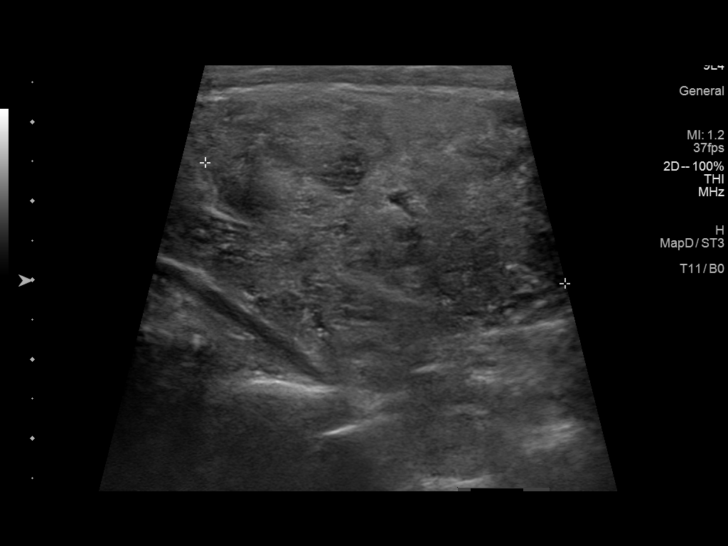
[im 6/13]
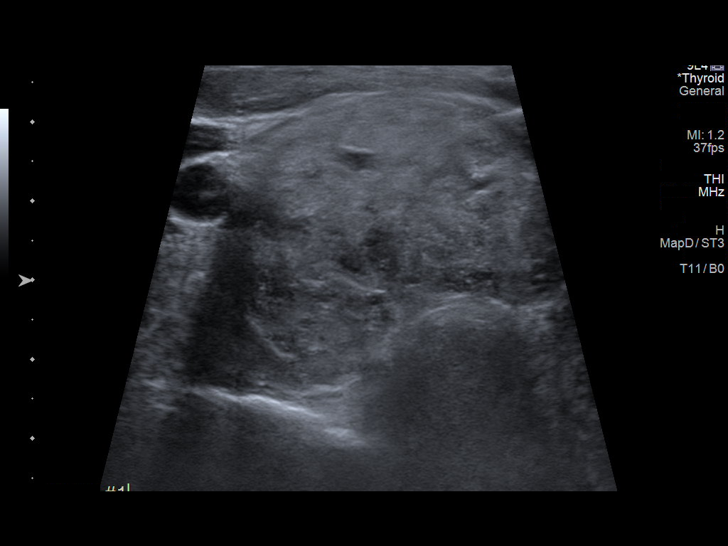
[im 7/13]
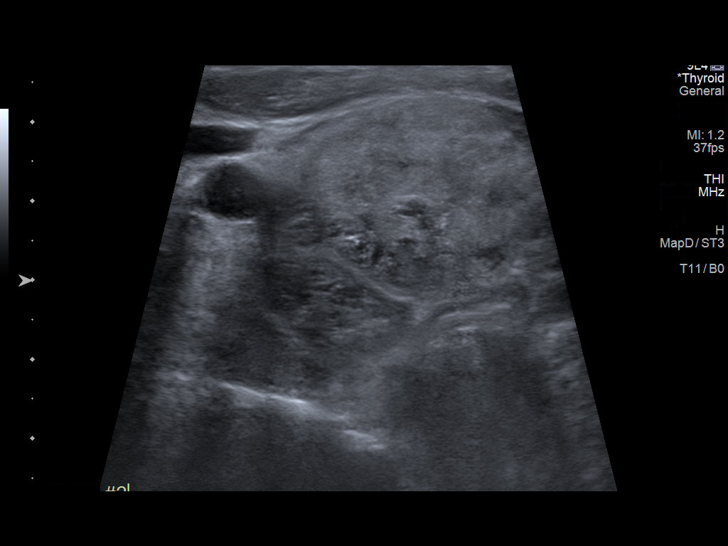
[im 8/13]
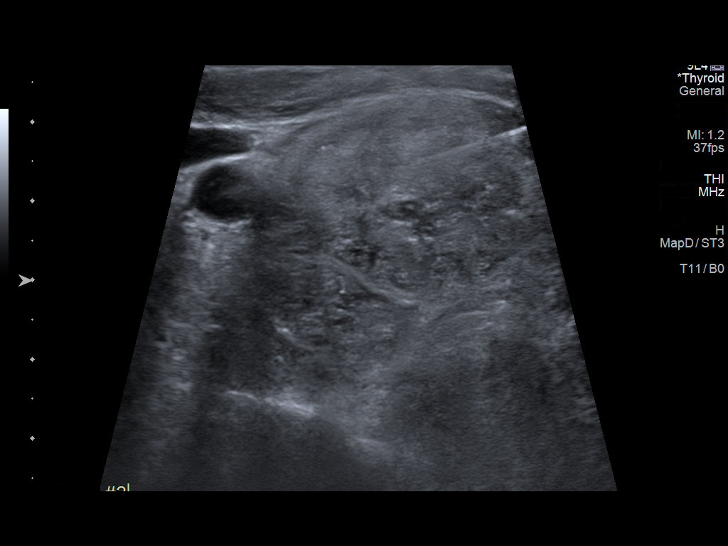
[im 9/13]
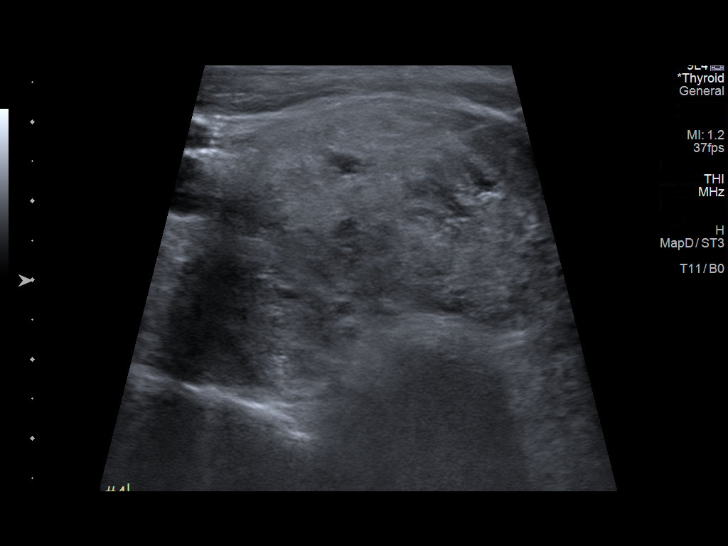
[im 10/13]
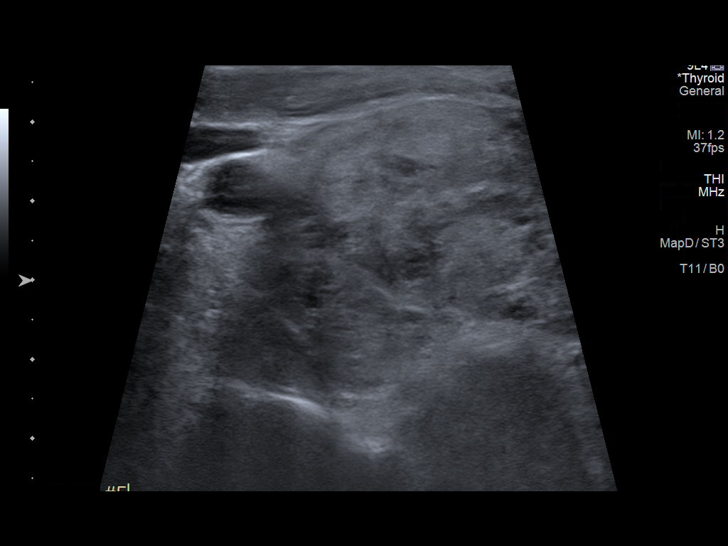
[im 11/13]
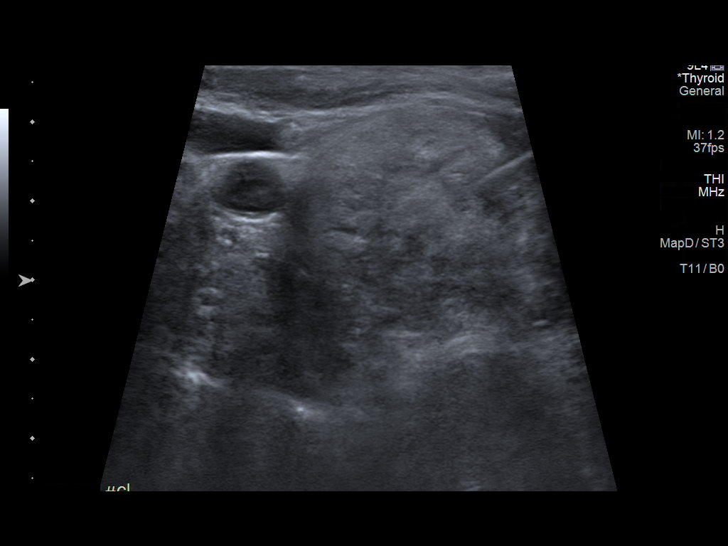
[im 12/13]
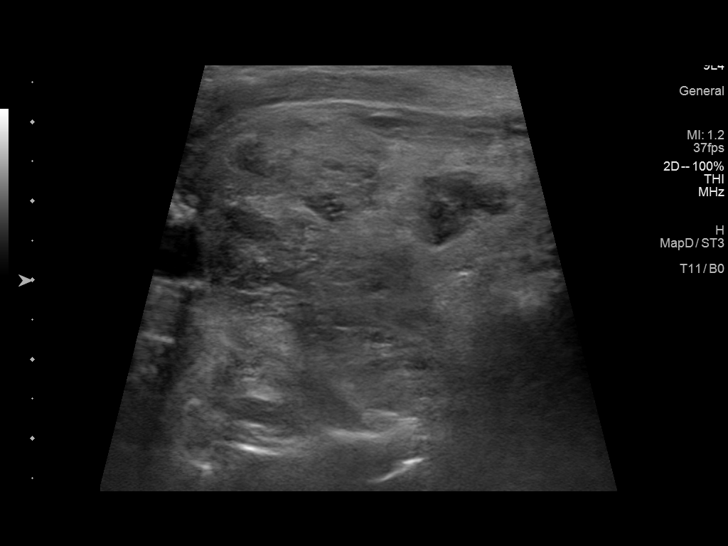
[im 13/13]
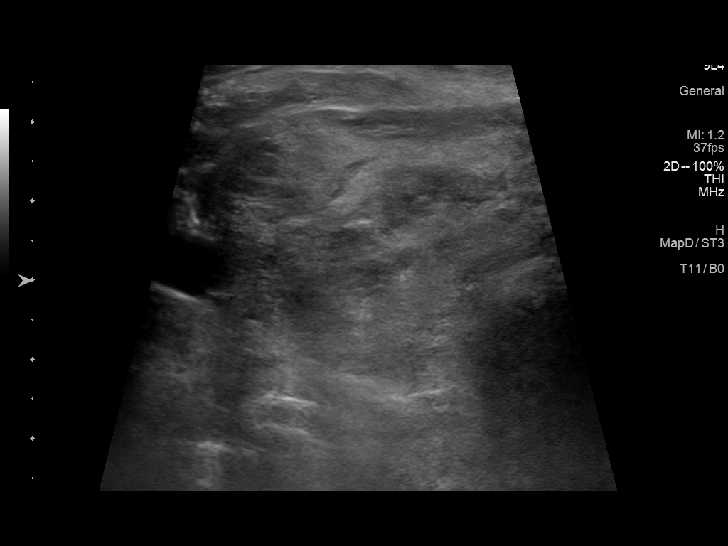

[13 of 13 positions shown; findings below may reference images not displayed]

Pre-procedural ultrasound scanning demonstrated unchanged size and
appearance of the indeterminate nodule within the right thyroid lobe

The procedure was planned. The neck was prepped in the usual sterile
fashion, and a sterile drape was applied covering the operative
field. A timeout was performed prior to the initiation of the
procedure. Local anesthesia was provided with 1% lidocaine.

Under direct ultrasound guidance, 6 FNA biopsies were performed of
the right thyroid nodule with a 25 gauge needle. Multiple ultrasound
images were saved for procedural documentation purposes. The samples
were prepared and submitted to pathology.

Limited post procedural scanning was negative for hematoma or
additional complication. Dressings were placed. The patient
tolerated the above procedures procedure well without immediate
postprocedural complication.
FINDINGS: Nodule reference number based on prior diagnostic ultrasound: 1

Maximum size: 4.8 cm

Location: Right; mid

ACR TI-RADS risk category: TR3 (3 points)

Reason for biopsy: meets ACR TI-RADS criteria

Ultrasound imaging confirms appropriate placement of the needles
within the thyroid nodule.
IMPRESSION: Technically successful ultrasound guided fine needle aspiration of
right thyroid nodule.

## 2022-07-07 ENCOUNTER — Other Ambulatory Visit: Payer: Self-pay | Admitting: Internal Medicine

## 2022-07-07 DIAGNOSIS — E041 Nontoxic single thyroid nodule: Secondary | ICD-10-CM

## 2022-07-07 DIAGNOSIS — Z0000 Encounter for general adult medical examination without abnormal findings: Secondary | ICD-10-CM

## 2022-07-12 ENCOUNTER — Ambulatory Visit
Admission: RE | Admit: 2022-07-12 | Discharge: 2022-07-12 | Disposition: A | Discharge status: Home / Self Care | Payer: Medicare Other | Source: Ambulatory Visit | Attending: Internal Medicine | Admitting: Internal Medicine

## 2022-07-12 DIAGNOSIS — E041 Nontoxic single thyroid nodule: Secondary | ICD-10-CM

## 2022-07-28 ENCOUNTER — Ambulatory Visit
Admission: RE | Admit: 2022-07-28 | Discharge: 2022-07-28 | Disposition: A | Discharge status: Home / Self Care | Payer: Medicare Other | Source: Ambulatory Visit | Attending: Internal Medicine | Admitting: Internal Medicine

## 2022-07-28 DIAGNOSIS — Z0000 Encounter for general adult medical examination without abnormal findings: Secondary | ICD-10-CM

## 2023-01-12 ENCOUNTER — Other Ambulatory Visit: Payer: Self-pay | Admitting: Internal Medicine

## 2023-01-12 DIAGNOSIS — E041 Nontoxic single thyroid nodule: Secondary | ICD-10-CM

## 2023-05-25 ENCOUNTER — Encounter: Payer: Self-pay | Admitting: Gastroenterology

## 2023-07-13 ENCOUNTER — Ambulatory Visit
Admission: RE | Admit: 2023-07-13 | Discharge: 2023-07-13 | Disposition: A | Discharge status: Home / Self Care | Payer: Medicare Other | Source: Ambulatory Visit | Attending: Internal Medicine | Admitting: Internal Medicine

## 2023-07-13 DIAGNOSIS — E041 Nontoxic single thyroid nodule: Secondary | ICD-10-CM

## 2023-07-25 ENCOUNTER — Other Ambulatory Visit: Payer: Self-pay | Admitting: Internal Medicine

## 2023-07-25 DIAGNOSIS — R1310 Dysphagia, unspecified: Secondary | ICD-10-CM

## 2023-07-25 DIAGNOSIS — E041 Nontoxic single thyroid nodule: Secondary | ICD-10-CM

## 2023-08-16 ENCOUNTER — Ambulatory Visit
Admission: RE | Admit: 2023-08-16 | Discharge: 2023-08-16 | Disposition: A | Discharge status: Home / Self Care | Payer: Medicare Other | Source: Ambulatory Visit | Attending: Internal Medicine | Admitting: Internal Medicine

## 2023-08-16 DIAGNOSIS — R1310 Dysphagia, unspecified: Secondary | ICD-10-CM

## 2023-10-28 ENCOUNTER — Encounter (HOSPITAL_COMMUNITY): Payer: Self-pay | Admitting: Orthopaedic Surgery

## 2023-10-28 ENCOUNTER — Other Ambulatory Visit: Payer: Self-pay

## 2023-10-28 NOTE — Progress Notes (Signed)
SDW CALL  Patient was given pre-op instructions over the phone. The opportunity was given for the patient to ask questions. No further questions asked. Patient verbalized understanding of instructions given.   PCP - Barbera Setters Cardiologist - denies  PPM/ICD - denies Device Orders -  Rep Notified -   Chest x-ray - na EKG - DOS Stress Test - Denies ECHO - Denies Cardiac Cath - denies  Sleep Study - denies CPAP -   Fasting Blood Sugar - na Checks Blood Sugar _____ times a day  Blood Thinner Instructions:na Aspirin Instructions:na  ERAS Protcol -clears until 0430 PRE-SURGERY Ensure or G2- no  COVID TEST- na   Anesthesia review: na  Patient denies shortness of breath, fever, cough and chest pain over the phone call    Surgical Instructions    Your procedure is scheduled on 11/4  Report to [facility] Main Entrance "A" at 0530 A.M., then check in with the Admitting office.  Call this number if you have problems the morning of surgery:  [phone]    Remember:  Do not eat after midnight the night before your surgery  You may drink clear liquids until 0430 the morning of your surgery.   Clear liquids allowed are: Water, Non-Citrus Juices (without pulp), Carbonated Beverages, Clear Tea, Black Coffee ONLY (NO MILK, CREAM OR POWDERED CREAMER of any kind), and Gatorade   Take these medicines the morning of surgery with A SIP OF WATER: Norvasc,Toprol XL. PRN- Tylenol  As of today, STOP taking any Aspirin (unless otherwise instructed by your surgeon), Mobic(Meloxicam),Aleve, Naproxen, Ibuprofen, Motrin, Advil, Goody's, BC's, all herbal medications, fish oil, and all vitamins.  [facility] is not responsible for any belongings or valuables. .   Do NOT Smoke (Tobacco/Vaping)  24 hours prior to your procedure  If you use a CPAP at night, you may bring your mask for your overnight stay.   Contacts, glasses, hearing aids, dentures or partials may not be worn into  surgery, please bring cases for these belongings   Patients discharged the day of surgery will not be allowed to drive home, and someone needs to stay with them for 24 hours.    Special instructions:    Oral Hygiene is also important to reduce your risk of infection.  Remember - BRUSH YOUR TEETH THE MORNING OF SURGERY WITH YOUR REGULAR TOOTHPASTE   Day of Surgery:  Take a shower the day of or night before with antibacterial soap. Wear Clean/Comfortable clothing the morning of surgery Do not apply any deodorants/lotions.   Do not wear jewelry or makeup Do not wear lotions, powders, perfumes/colognes, or deodorant. Do not shave 48 hours prior to surgery.  Men may shave face and neck. Do not bring valuables to the hospital. Do not wear nail polish, gel polish, artificial nails, or any other type of covering on natural nails (fingers and toes) If you have artificial nails or gel coating that need to be removed by a nail salon, please have this removed prior to surgery. Artificial nails or gel coating may interfere with anesthesia's ability to adequately monitor your vital signs. Remember to brush your teeth WITH YOUR REGULAR TOOTHPASTE.

## 2023-10-30 NOTE — Anesthesia Preprocedure Evaluation (Signed)
Anesthesia Evaluation  Patient identified by MRN, date of birth, ID band Patient awake    Reviewed: Allergy & Precautions, NPO status , Patient's Chart, lab work & pertinent test results, reviewed documented beta blocker date and time   History of Anesthesia Complications Negative for: history of anesthetic complications  Airway Mallampati: II  TM Distance: >3 FB Neck ROM: Full    Dental  (+) Dental Advisory Given, Partial Lower   Pulmonary former smoker   Pulmonary exam normal        Cardiovascular hypertension, Pt. on medications and Pt. on home beta blockers Normal cardiovascular exam     Neuro/Psych negative neurological ROS  negative psych ROS   GI/Hepatic Neg liver ROS,GERD  Controlled,,  Endo/Other   Obesity   Renal/GU negative Renal ROS     Musculoskeletal  (+) Arthritis ,    Abdominal   Peds  Hematology negative hematology ROS (+)   Anesthesia Other Findings   Reproductive/Obstetrics                             Anesthesia Physical Anesthesia Plan  ASA: 2  Anesthesia Plan: General   Post-op Pain Management: Tylenol PO (pre-op)* and Regional block*   Induction: Intravenous  PONV Risk Score and Plan: 3 and Treatment may vary due to age or medical condition, Ondansetron and Propofol infusion  Airway Management Planned: LMA  Additional Equipment: None  Intra-op Plan:   Post-operative Plan: Extubation in OR  Informed Consent: I have reviewed the patients History and Physical, chart, labs and discussed the procedure including the risks, benefits and alternatives for the proposed anesthesia with the patient or authorized representative who has indicated his/her understanding and acceptance.     Dental advisory given  Plan Discussed with: CRNA and Anesthesiologist  Anesthesia Plan Comments:        Anesthesia Quick Evaluation

## 2023-10-30 NOTE — H&P (Signed)
ORTHOPAEDIC SURGERY H&P  Subjective:  The patient presents with right ankle fx.   Past Medical History:  Diagnosis Date   Arthritis    Blood transfusion without reported diagnosis    GERD (gastroesophageal reflux disease)    Hyperlipidemia    Hypertension    Pneumonia    Thyroid disease    enlarged thyroid    Past Surgical History:  Procedure Laterality Date   ABDOMINAL HYSTERECTOMY     APPENDECTOMY     CATARACT EXTRACTION, BILATERAL     CHOLECYSTECTOMY OPEN     COLONOSCOPY     DILATION AND CURETTAGE OF UTERUS     LYMPH NODE DISSECTION     right arm   TONSILLECTOMY     tumor right foot       (Not in an outpatient encounter)    Allergies  Allergen Reactions   Codeine     passes out    Social History   Socioeconomic History   Marital status: Widowed    Spouse name: Not on file   Number of children: Not on file   Years of education: Not on file   Highest education level: Not on file  Occupational History   Not on file  Tobacco Use   Smoking status: Former    Current packs/day: 0.00    Types: Cigarettes    Quit date: 06/12/1976    Years since quitting: 47.4   Smokeless tobacco: Never  Vaping Use   Vaping status: Never Used  Substance and Sexual Activity   Alcohol use: No    Alcohol/week: 0.0 standard drinks of alcohol   Drug use: No   Sexual activity: Not on file  Other Topics Concern   Not on file  Social History Narrative   Not on file   Social Determinants of Health   Financial Resource Strain: Not on file  Food Insecurity: Not on file  Transportation Needs: Not on file  Physical Activity: Not on file  Stress: Not on file  Social Connections: Not on file  Intimate Partner Violence: Not on file     History reviewed. No pertinent family history.   Review of Systems Pertinent items are noted in HPI.  Objective: Vital signs in last 24 hours:    06/12/2020    9:52 AM 06/12/2020    9:42 AM 06/12/2020    9:32 AM  Vitals with BMI   Systolic 116 121 782  Diastolic 56 56 53  Pulse 68 65 65      EXAM: General: Well nourished, well developed. Awake, alert and oriented to time, place, person. Normal mood and affect. No apparent distress. Breathing room air.  Operative Lower Extremity: Alignment - Neutral Deformity - None Skin intact Tenderness to palpation - right ankle 5/5 TA, PT, GS, Per, EHL, FHL Sensation intact to light touch throughout Palpable DP and PT pulses Special testing: None  The contralateral foot/ankle was examined for comparison and noted to be neurovascularly intact with no localized deformity, swelling, or tenderness.  Imaging Review All images taken were independently reviewed by me.  Assessment/Plan: The clinical and radiographic findings were reviewed and discussed at length with the patient.  The patient has right ankle fx.  We spoke at length about the natural course of these findings. We discussed nonoperative and operative treatment options in detail.  The risks and benefits were presented and reviewed. The risks due to hardware failure/irritation, new/persistent/recurrent infection, stiffness, nerve/vessel/tendon injury, nonunion/malunion of any fracture, wound healing issues, allograft usage, development  of arthritis, failure of this surgery, possibility of external fixation in certain situations, possibility of delayed definitive surgery, need for further surgery, prolonged wound care including further soft tissue coverage procedures, thromboembolic events, anesthesia/medical complications/events perioperatively and beyond, amputation, death among others were discussed. The patient acknowledged the explanation and agreed to proceed with the plan.  Vanessa Wheeler  Orthopaedic Surgery [facility]

## 2023-10-30 NOTE — Discharge Instructions (Signed)

## 2023-10-31 ENCOUNTER — Other Ambulatory Visit: Payer: Self-pay

## 2023-10-31 ENCOUNTER — Ambulatory Visit (HOSPITAL_COMMUNITY)
Admission: RE | Admit: 2023-10-31 | Discharge: 2023-10-31 | Disposition: A | Discharge status: Home / Self Care | Payer: Medicare Other | Attending: Orthopaedic Surgery | Admitting: Orthopaedic Surgery

## 2023-10-31 ENCOUNTER — Ambulatory Visit (HOSPITAL_COMMUNITY): Payer: Medicare Other

## 2023-10-31 ENCOUNTER — Encounter (HOSPITAL_COMMUNITY)
Admission: RE | Disposition: A | Discharge status: Home / Self Care | Payer: Self-pay | Source: Home / Self Care | Attending: Orthopaedic Surgery

## 2023-10-31 ENCOUNTER — Encounter (HOSPITAL_COMMUNITY): Payer: Self-pay | Admitting: Orthopaedic Surgery

## 2023-10-31 ENCOUNTER — Ambulatory Visit (HOSPITAL_BASED_OUTPATIENT_CLINIC_OR_DEPARTMENT_OTHER): Payer: Medicare Other | Admitting: Anesthesiology

## 2023-10-31 ENCOUNTER — Ambulatory Visit (HOSPITAL_COMMUNITY): Payer: Medicare Other | Admitting: Anesthesiology

## 2023-10-31 DIAGNOSIS — S82201A Unspecified fracture of shaft of right tibia, initial encounter for closed fracture: Secondary | ICD-10-CM | POA: Diagnosis not present

## 2023-10-31 DIAGNOSIS — X58XXXA Exposure to other specified factors, initial encounter: Secondary | ICD-10-CM | POA: Insufficient documentation

## 2023-10-31 DIAGNOSIS — S82871A Displaced pilon fracture of right tibia, initial encounter for closed fracture: Secondary | ICD-10-CM | POA: Diagnosis not present

## 2023-10-31 DIAGNOSIS — Z9889 Other specified postprocedural states: Secondary | ICD-10-CM

## 2023-10-31 DIAGNOSIS — I10 Essential (primary) hypertension: Secondary | ICD-10-CM | POA: Insufficient documentation

## 2023-10-31 DIAGNOSIS — Z87891 Personal history of nicotine dependence: Secondary | ICD-10-CM | POA: Insufficient documentation

## 2023-10-31 DIAGNOSIS — S82831A Other fracture of upper and lower end of right fibula, initial encounter for closed fracture: Secondary | ICD-10-CM | POA: Insufficient documentation

## 2023-10-31 HISTORY — DX: Pneumonia, unspecified organism: J18.9

## 2023-10-31 HISTORY — PX: ORIF ANKLE FRACTURE: SHX5408

## 2023-10-31 LAB — BASIC METABOLIC PANEL
Anion gap: 7 (ref 5–15)
BUN: 18 mg/dL (ref 8–23)
CO2: 26 mmol/L (ref 22–32)
Calcium: 9.7 mg/dL (ref 8.9–10.3)
Chloride: 106 mmol/L (ref 98–111)
Creatinine, Ser: 1.57 mg/dL — ABNORMAL HIGH (ref 0.44–1.00)
GFR, Estimated: 33 mL/min — ABNORMAL LOW (ref >60)
Glucose, Bld: 109 mg/dL — ABNORMAL HIGH (ref 70–99)
Potassium: 4.5 mmol/L (ref 3.5–5.1)
Sodium: 139 mmol/L (ref 135–145)

## 2023-10-31 LAB — CBC
HCT: 37.1 % (ref 36.0–46.0)
Hemoglobin: 11.6 g/dL — ABNORMAL LOW (ref 12.0–15.0)
MCH: 29.4 pg (ref 26.0–34.0)
MCHC: 31.3 g/dL (ref 30.0–36.0)
MCV: 94.2 fL (ref 80.0–100.0)
Platelets: 377 10*3/uL (ref 150–400)
RBC: 3.94 MIL/uL (ref 3.87–5.11)
RDW: 13.4 % (ref 11.5–15.5)
WBC: 8.9 10*3/uL (ref 4.0–10.5)
nRBC: 0 % (ref 0.0–0.2)

## 2023-10-31 LAB — SURGICAL PCR SCREEN
MRSA, PCR: NEGATIVE
Staphylococcus aureus: NEGATIVE

## 2023-10-31 SURGERY — OPEN REDUCTION INTERNAL FIXATION (ORIF) ANKLE FRACTURE
Anesthesia: General | Site: Ankle | Laterality: Right

## 2023-10-31 MED ORDER — ONDANSETRON HCL 4 MG/2ML IJ SOLN
INTRAMUSCULAR | Status: DC | PRN
  Administered 2023-10-31: 4 mg via INTRAVENOUS

## 2023-10-31 MED ORDER — MIDAZOLAM HCL 2 MG/2ML IJ SOLN
INTRAMUSCULAR | Status: AC
  Filled 2023-10-31: qty 2

## 2023-10-31 MED ORDER — FENTANYL CITRATE (PF) 250 MCG/5ML IJ SOLN
INTRAMUSCULAR | Status: DC | PRN
  Administered 2023-10-31 (×2): 50 ug via INTRAVENOUS

## 2023-10-31 MED ORDER — ONDANSETRON HCL 4 MG/2ML IJ SOLN: 4.0000 mg | Freq: Once | INTRAMUSCULAR | Status: DC | PRN

## 2023-10-31 MED ORDER — EPHEDRINE 5 MG/ML INJ
INTRAVENOUS | Status: AC
  Filled 2023-10-31: qty 5

## 2023-10-31 MED ORDER — DEXAMETHASONE SODIUM PHOSPHATE 10 MG/ML IJ SOLN
INTRAMUSCULAR | Status: AC
  Filled 2023-10-31: qty 1

## 2023-10-31 MED ORDER — CHLORHEXIDINE GLUCONATE 4 % EX SOLN: 60.0000 mL | Freq: Once | CUTANEOUS | Status: DC

## 2023-10-31 MED ORDER — GLYCOPYRROLATE 0.2 MG/ML IJ SOLN
INTRAMUSCULAR | Status: DC | PRN
  Administered 2023-10-31 (×2): .1 mg via INTRAVENOUS

## 2023-10-31 MED ORDER — CEFAZOLIN SODIUM-DEXTROSE 2-4 GM/100ML-% IV SOLN
2.0000 g | INTRAVENOUS | Status: AC
  Administered 2023-10-31: 2 g via INTRAVENOUS
  Filled 2023-10-31: qty 100

## 2023-10-31 MED ORDER — VANCOMYCIN HCL 500 MG IV SOLR
INTRAVENOUS | Status: DC | PRN
  Administered 2023-10-31: 500 mg via TOPICAL

## 2023-10-31 MED ORDER — EPHEDRINE SULFATE-NACL 50-0.9 MG/10ML-% IV SOSY
PREFILLED_SYRINGE | INTRAVENOUS | Status: DC | PRN
  Administered 2023-10-31: 5 mg via INTRAVENOUS

## 2023-10-31 MED ORDER — FENTANYL CITRATE (PF) 100 MCG/2ML IJ SOLN
25.0000 ug | INTRAMUSCULAR | Status: DC | PRN
Start: 2023-10-31 — End: 2023-10-31

## 2023-10-31 MED ORDER — ORAL CARE MOUTH RINSE: 15.0000 mL | Freq: Once | OROMUCOSAL | Status: AC

## 2023-10-31 MED ORDER — FENTANYL CITRATE (PF) 250 MCG/5ML IJ SOLN
INTRAMUSCULAR | Status: AC
  Filled 2023-10-31: qty 5

## 2023-10-31 MED ORDER — VANCOMYCIN HCL 500 MG IV SOLR
INTRAVENOUS | Status: AC
  Filled 2023-10-31: qty 10

## 2023-10-31 MED ORDER — CHLORHEXIDINE GLUCONATE 0.12 % MT SOLN
15.0000 mL | Freq: Once | OROMUCOSAL | Status: AC
  Administered 2023-10-31: 15 mL via OROMUCOSAL
  Filled 2023-10-31: qty 15

## 2023-10-31 MED ORDER — PHENYLEPHRINE HCL-NACL 20-0.9 MG/250ML-% IV SOLN
INTRAVENOUS | Status: DC | PRN
  Administered 2023-10-31: 50 ug/min via INTRAVENOUS

## 2023-10-31 MED ORDER — DEXAMETHASONE SODIUM PHOSPHATE 10 MG/ML IJ SOLN
INTRAMUSCULAR | Status: DC | PRN
  Administered 2023-10-31: 5 mg via INTRAVENOUS

## 2023-10-31 MED ORDER — LACTATED RINGERS IV SOLN: INTRAVENOUS | Status: DC | PRN

## 2023-10-31 MED ORDER — ONDANSETRON HCL 4 MG/2ML IJ SOLN
INTRAMUSCULAR | Status: AC
Start: 2023-10-31 — End: ?
  Filled 2023-10-31: qty 2

## 2023-10-31 MED ORDER — BUPIVACAINE-EPINEPHRINE (PF) 0.5% -1:200000 IJ SOLN
INTRAMUSCULAR | Status: DC | PRN
  Administered 2023-10-31: 25 mL via PERINEURAL
  Administered 2023-10-31: 15 mL via PERINEURAL

## 2023-10-31 MED ORDER — PROPOFOL 10 MG/ML IV BOLUS
INTRAVENOUS | Status: AC
  Filled 2023-10-31: qty 20

## 2023-10-31 MED ORDER — OXYCODONE HCL 5 MG PO TABS: 5.0000 mg | ORAL_TABLET | Freq: Once | ORAL | Status: DC | PRN

## 2023-10-31 MED ORDER — PROPOFOL 10 MG/ML IV BOLUS
INTRAVENOUS | Status: DC | PRN
  Administered 2023-10-31: 150 mg via INTRAVENOUS

## 2023-10-31 MED ORDER — OXYCODONE HCL 5 MG/5ML PO SOLN: 5.0000 mg | Freq: Once | ORAL | Status: DC | PRN

## 2023-10-31 MED ORDER — SODIUM CHLORIDE 0.9 % IR SOLN
Status: DC | PRN
  Administered 2023-10-31: 1000 mL

## 2023-10-31 MED ORDER — ACETAMINOPHEN 500 MG PO TABS
1000.0000 mg | ORAL_TABLET | Freq: Once | ORAL | Status: AC
  Administered 2023-10-31: 1000 mg via ORAL
  Filled 2023-10-31: qty 2

## 2023-10-31 SURGICAL SUPPLY — 85 items
ANCHOR SUT KEITH ABD SZ2 STR (SUTURE) ×1 IMPLANT
APL PRP STRL LF DISP 70% ISPRP (MISCELLANEOUS) ×4
BAG COUNTER SPONGE SURGICOUNT (BAG) ×1 IMPLANT
BAG SPNG CNTER NS LX DISP (BAG) ×2
BANDAGE ESMARK 6X9 LF (GAUZE/BANDAGES/DRESSINGS) ×1 IMPLANT
BIT DRILL SHORT 2.5 (BIT) ×1 IMPLANT
BIT DRL SHORT 2.5 (BIT) ×2
BLADE LONG MED 31X9 (MISCELLANEOUS) ×1 IMPLANT
BLADE SURG 15 STRL LF DISP TIS (BLADE) ×4 IMPLANT
BLADE SURG 15 STRL SS (BLADE) ×4
BNDG CMPR 5X4 CHSV STRCH STRL (GAUZE/BANDAGES/DRESSINGS)
BNDG CMPR 5X6 CHSV STRCH STRL (GAUZE/BANDAGES/DRESSINGS)
BNDG CMPR 9X6 STRL LF SNTH (GAUZE/BANDAGES/DRESSINGS)
BNDG CMPR MED 10X6 ELC LF (GAUZE/BANDAGES/DRESSINGS) ×2
BNDG COHESIVE 4X5 TAN STRL LF (GAUZE/BANDAGES/DRESSINGS) ×1 IMPLANT
BNDG COHESIVE 6X5 TAN NS LF (GAUZE/BANDAGES/DRESSINGS) ×1 IMPLANT
BNDG COHESIVE 6X5 TAN ST LF (GAUZE/BANDAGES/DRESSINGS) ×1 IMPLANT
BNDG ELASTIC 4X5.8 VLCR STR LF (GAUZE/BANDAGES/DRESSINGS) ×1 IMPLANT
BNDG ELASTIC 6X10 VLCR STRL LF (GAUZE/BANDAGES/DRESSINGS) ×1 IMPLANT
BNDG ELASTIC 6X5.8 VLCR STR LF (GAUZE/BANDAGES/DRESSINGS) ×1 IMPLANT
BNDG ESMARK 6X9 LF (GAUZE/BANDAGES/DRESSINGS)
BNDG GAUZE DERMACEA FLUFF 4 (GAUZE/BANDAGES/DRESSINGS) ×2 IMPLANT
BNDG GZE DERMACEA 4 6PLY (GAUZE/BANDAGES/DRESSINGS) ×2
CHLORAPREP W/TINT 26 (MISCELLANEOUS) ×4 IMPLANT
COVER SURGICAL LIGHT HANDLE (MISCELLANEOUS) ×2 IMPLANT
CUFF TOURN SGL QUICK 34 (TOURNIQUET CUFF) ×2
CUFF TRNQT CYL 34X4.125X (TOURNIQUET CUFF) ×2 IMPLANT
DRAPE C-ARM 42X120 X-RAY (DRAPES) ×1 IMPLANT
DRAPE C-ARM MINI 42X72 WSTRAPS (DRAPES) ×1 IMPLANT
DRAPE C-ARMOR (DRAPES) ×2 IMPLANT
DRAPE EXTREMITY T 121X128X90 (DISPOSABLE) ×2 IMPLANT
DRAPE OEC MINIVIEW 54X84 (DRAPES) ×1 IMPLANT
DRAPE SURG ORHT 6 SPLT 77X108 (DRAPES) ×4 IMPLANT
DRAPE U-SHAPE 47X51 STRL (DRAPES) ×2 IMPLANT
DRSG ADAPTIC 3X8 NADH LF (GAUZE/BANDAGES/DRESSINGS) ×1 IMPLANT
DRSG MEPITEL 4X7.2 (GAUZE/BANDAGES/DRESSINGS) ×1 IMPLANT
ELECT REM PT RETURN 15FT ADLT (MISCELLANEOUS) ×2 IMPLANT
FACESHIELD WRAPAROUND (MASK) IMPLANT
FACESHIELD WRAPAROUND OR TEAM (MASK) ×1 IMPLANT
GAUZE PAD ABD 8X10 STRL (GAUZE/BANDAGES/DRESSINGS) ×8 IMPLANT
GAUZE SPONGE 4X4 12PLY STRL (GAUZE/BANDAGES/DRESSINGS) ×3 IMPLANT
GAUZE XEROFORM 5X9 LF (GAUZE/BANDAGES/DRESSINGS) ×1 IMPLANT
GLOVE SURG ENC MOIS LTX SZ7.5 (GLOVE) ×2 IMPLANT
GLOVE SURG MICRO LTX SZ7.5 (GLOVE) ×2 IMPLANT
GLOVE SURG POLYISO LF SZ7.5 (GLOVE) ×2 IMPLANT
GLOVE SURG UNDER POLY LF SZ7.5 (GLOVE) ×4 IMPLANT
GOWN STRL REUS W/ TWL LRG LVL3 (GOWN DISPOSABLE) ×4 IMPLANT
GOWN STRL REUS W/TWL LRG LVL3 (GOWN DISPOSABLE) ×6
K-WIRE ALPS MXV 1.6X6 ZI (WIRE) ×6
KIT BASIN OR (CUSTOM PROCEDURE TRAY) ×2 IMPLANT
KIT TURNOVER KIT A (KITS) ×1 IMPLANT
KWIRE ALPS MXV 1.6X6 ZI (WIRE) ×3 IMPLANT
NDL HYPO 22X1.5 SAFETY MO (MISCELLANEOUS) ×1 IMPLANT
NDL MAYO CATGUT SZ4 TPR NDL (NEEDLE) IMPLANT
NEEDLE HYPO 22X1.5 SAFETY MO (MISCELLANEOUS) ×2 IMPLANT
NEEDLE MAYO CATGUT SZ4 (NEEDLE) IMPLANT
NS IRRIG 1000ML POUR BTL (IV SOLUTION) ×2 IMPLANT
PACK ORTHO EXTREMITY (CUSTOM PROCEDURE TRAY) ×2 IMPLANT
PAD CAST 4YDX4 CTTN HI CHSV (CAST SUPPLIES) ×6 IMPLANT
PADDING CAST COTTON 4X4 STRL (CAST SUPPLIES)
PADDING CAST COTTON 6X4 STRL (CAST SUPPLIES) ×1 IMPLANT
PLATE TIB MED 6H RT (Plate) ×1 IMPLANT
PROTECTOR NERVE ULNAR (MISCELLANEOUS) ×1 IMPLANT
SCREW LOCK MDS 3.5X22 (Screw) ×1 IMPLANT
SCREW LOCK MDS 3.5X30 (Screw) ×1 IMPLANT
SCREW LOCK MDS 3.5X32 (Screw) ×1 IMPLANT
SCREW NLOCK 3.5X24 (Screw) ×1 IMPLANT
SCREW NLOCK 3.5X34 (Screw) ×1 IMPLANT
SPIKE FLUID TRANSFER (MISCELLANEOUS) IMPLANT
SPLINT PLASTER CAST XFAST 5X30 (CAST SUPPLIES) ×1 IMPLANT
SPONGE T-LAP 18X18 ~~LOC~~+RFID (SPONGE) ×1 IMPLANT
STAPLER VISISTAT 35W (STAPLE) ×1 IMPLANT
STOCKINETTE TUBULAR 6 INCH (GAUZE/BANDAGES/DRESSINGS) ×2 IMPLANT
STOCKINETTE TUBULAR COTT 4X25 (GAUZE/BANDAGES/DRESSINGS) ×1 IMPLANT
STRIP CLOSURE SKIN 1/2X4 (GAUZE/BANDAGES/DRESSINGS) ×1 IMPLANT
SUCTION TUBE FRAZIER 10FR DISP (SUCTIONS) ×1 IMPLANT
SUT ETHILON 3 0 PS 1 (SUTURE) ×2 IMPLANT
SUT MON AB 3-0 SH 27 (SUTURE) ×2
SUT MON AB 3-0 SH27 (SUTURE) ×2 IMPLANT
SUT VIC AB 2-0 SH 27 (SUTURE) ×2
SUT VIC AB 2-0 SH 27XBRD (SUTURE) ×2 IMPLANT
SUT VIC AB 3-0 SH 27 (SUTURE) ×4
SUT VIC AB 3-0 SH 27X BRD (SUTURE) ×4 IMPLANT
SYR CONTROL 10ML LL (SYRINGE) ×2 IMPLANT
WATER STERILE IRR 1000ML POUR (IV SOLUTION) ×2 IMPLANT

## 2023-10-31 NOTE — Anesthesia Procedure Notes (Signed)
Anesthesia Regional Block: Popliteal block   Pre-Anesthetic Checklist: , timeout performed,  Correct Patient, Correct Site, Correct Laterality,  Correct Procedure, Correct Position, site marked,  Risks and benefits discussed,  Surgical consent,  Pre-op evaluation,  At surgeon's request and post-op pain management  Laterality: Right  Prep: chloraprep       Needles:  Injection technique: Single-shot  Needle Type: Echogenic Needle     Needle Length: 10cm  Needle Gauge: 21     Additional Needles:   Narrative:  Start time: 10/31/2023 7:04 AM End time: 10/31/2023 7:07 AM Injection made incrementally with aspirations every 5 mL.  Performed by: Personally  Anesthesiologist: Beryle Lathe, MD  Additional Notes: No pain on injection. No increased resistance to injection. Injection made in 5cc increments. Good needle visualization. Patient tolerated the procedure well.

## 2023-10-31 NOTE — Anesthesia Procedure Notes (Signed)
Anesthesia Regional Block: Adductor canal block   Pre-Anesthetic Checklist: , timeout performed,  Correct Patient, Correct Site, Correct Laterality,  Correct Procedure, Correct Position, site marked,  Risks and benefits discussed,  Surgical consent,  Pre-op evaluation,  At surgeon's request and post-op pain management  Laterality: Right  Prep: chloraprep       Needles:  Injection technique: Single-shot  Needle Type: Echogenic Needle     Needle Length: 10cm  Needle Gauge: 21     Additional Needles:   Narrative:  Start time: 10/31/2023 7:01 AM End time: 10/31/2023 7:04 AM Injection made incrementally with aspirations every 5 mL.  Performed by: Personally  Anesthesiologist: Beryle Lathe, MD  Additional Notes: No pain on injection. No increased resistance to injection. Injection made in 5cc increments. Good needle visualization. Patient tolerated the procedure well.

## 2023-10-31 NOTE — Op Note (Signed)
10/31/2023  10:45 PM   PATIENT: Vanessa Wheeler  82 y.o. female  MRN: [id number]   PRE-OPERATIVE DIAGNOSIS:   Right distal tibia shaft fracture with comminuted distal fibula fracture   POST-OPERATIVE DIAGNOSIS:   Same   PROCEDURE: Open reduction internal fixation of right distal tibia shaft with closed reduction of distal fibula fracture (CPT 75643)   SURGEON:  Netta Cedars, MD   ASSISTANT: None   ANESTHESIA: General, regional   EBL: Minimal   TOURNIQUET:    Total Tourniquet Time Documented: Thigh (Right) - 36 minutes Total: Thigh (Right) - 36 minutes    COMPLICATIONS: None apparent   DISPOSITION: Extubated, awake and stable to recovery.   INDICATION FOR PROCEDURE: The patient presented with above diagnosis.  We discussed the diagnosis, alternative treatment options, risks and benefits of the above surgical intervention, as well as alternative non-operative treatments. All questions/concerns were addressed and the patient/family demonstrated appropriate understanding of the diagnosis, the procedure, the postoperative course, and overall prognosis. The patient wished to proceed with surgical intervention and signed an informed surgical consent as such, in each others presence prior to surgery.   PROCEDURE IN DETAIL: After preoperative consent was obtained and the correct operative site was identified, the patient was brought to the operating room supine on stretcher and transferred onto operating table. General anesthesia was induced. Preoperative antibiotics were administered. Surgical timeout was taken. The patient was then positioned supine with an ipsilateral hip bump. The operative lower extremity was prepped and draped in standard sterile fashion with a tourniquet around the thigh. The extremity was exsanguinated and the tourniquet was inflated to 275 mmHg.  We then made a direct medial ankle approach and extended this proximally in anticipation of  implanting a pilon plate. Dissection was carried down to the level of the medial malleolus. A dental pick and freer elevator were used to reduce the distal tibia fracture. A Zimmer ALPS medial distal tibia pilon plate was utilized to fix the reduced tibia. The plate was oriented to best capture all major fragments of the comminution. We placed a non-locking screw in the hook plate and subsequently implanted another locking screw proximally to further secure the plate.  The distal fibula fracture was closed reduced to appropriate length, rotation and alignment of the fracture as verified by intraoperative fluoroscopy.  The surgical sites were thoroughly irrigated. The tourniquet was deflated and hemostasis achieved. Betadine and vancomycin powder were applied. The deep layers were closed using 2-0 vicryl. The skin was closed without tension.    The leg was cleaned with saline and sterile dressings with gauze were applied. A well padded bulky short leg splint was applied. The patient was awakened from anesthesia and transported to the recovery room in stable condition.    FOLLOW UP PLAN: -transfer to PACU, then home -strict NWB operative extremity, maximum elevation -maintain short leg splint until follow up -DVT ppx: Aspirin 81 mg twice daily while NWB -follow up as outpatient within 7-10 days for wound check with exchange of short leg splint to short leg cast -sutures out in 2-3 weeks in outpatient office   RADIOGRAPHS: AP, lateral, oblique and stress radiographs of the right ankle were obtained intraoperatively. These showed interval reduction and fixation of the fractures. Manual stress radiographs were taken and the joints were noted to be stable following fixation. All hardware is appropriately positioned and of the appropriate lengths. No other acute injuries are noted.   Netta Cedars Orthopaedic Surgery [facility]

## 2023-10-31 NOTE — H&P (Signed)

## 2023-10-31 NOTE — Anesthesia Postprocedure Evaluation (Signed)
Anesthesia Post Note  Patient: Vanessa Wheeler  Procedure(s) Performed: OPEN REDUCTION INTERNAL FIXATION (ORIF) right pilon ANKLE FRACTURE, CLOSED REDUCTION OF FIBLULA (Right: Ankle)     Patient location during evaluation: PACU Anesthesia Type: General Level of consciousness: awake and alert Pain management: pain level controlled Vital Signs Assessment: post-procedure vital signs reviewed and stable Respiratory status: spontaneous breathing, nonlabored ventilation and respiratory function stable Cardiovascular status: stable and blood pressure returned to baseline Anesthetic complications: no   No notable events documented.  Last Vitals:  Vitals:   10/31/23 0915 10/31/23 0921  BP: 132/66 131/60  Pulse: 69 68  Resp: 14 11  Temp:  36.8 C  SpO2: 96% 97%    Last Pain:  Vitals:   10/31/23 0848  TempSrc:   PainSc: Asleep                 Beryle Lathe

## 2023-10-31 NOTE — Transfer of Care (Signed)
Immediate Anesthesia Transfer of Care Note  Patient: Vanessa Wheeler  Procedure(s) Performed: OPEN REDUCTION INTERNAL FIXATION (ORIF) right pilon ANKLE FRACTURE, CLOSED REDUCTION OF FIBLULA (Right: Ankle)  Patient Location: PACU  Anesthesia Type:General  Level of Consciousness: sedated  Airway & Oxygen Therapy: Patient Spontanous Breathing  Post-op Assessment: Report given to RN and Post -op Vital signs reviewed and stable  Post vital signs: Reviewed and stable  Last Vitals:  Vitals Value Taken Time  BP 114/50 10/31/23 0848  Temp 36.8 C 10/31/23 0848  Pulse 72 10/31/23 0852  Resp 12 10/31/23 0852  SpO2 98 % 10/31/23 0852  Vitals shown include unfiled device data.  Last Pain:  Vitals:   10/31/23 0555  TempSrc: Oral         Complications: No notable events documented.

## 2023-10-31 NOTE — Anesthesia Procedure Notes (Signed)
Procedure Name: LMA Insertion Date/Time: 10/31/2023 7:35 AM  Performed by: Caryn Bee A, CRNAPre-anesthesia Checklist: Patient identified, Emergency Drugs available, Suction available and Patient being monitored Patient Re-evaluated:Patient Re-evaluated prior to induction Oxygen Delivery Method: Circle System Utilized Preoxygenation: Pre-oxygenation with 100% oxygen Induction Type: IV induction Ventilation: Mask ventilation without difficulty LMA: LMA inserted LMA Size: 4.0 Number of attempts: 1 Airway Equipment and Method: Bite block Placement Confirmation: positive ETCO2 Tube secured with: Tape Dental Injury: Teeth and Oropharynx as per pre-operative assessment

## 2023-11-03 ENCOUNTER — Encounter (HOSPITAL_COMMUNITY): Payer: Self-pay | Admitting: Orthopaedic Surgery

## 2024-03-01 ENCOUNTER — Other Ambulatory Visit: Payer: Self-pay | Admitting: Internal Medicine

## 2024-03-01 ENCOUNTER — Encounter: Payer: Self-pay | Admitting: Internal Medicine

## 2024-03-01 DIAGNOSIS — Z780 Asymptomatic menopausal state: Secondary | ICD-10-CM

## 2024-03-02 ENCOUNTER — Other Ambulatory Visit: Payer: Self-pay | Admitting: Internal Medicine

## 2024-03-02 DIAGNOSIS — Z1231 Encounter for screening mammogram for malignant neoplasm of breast: Secondary | ICD-10-CM

## 2024-03-13 ENCOUNTER — Ambulatory Visit
Admission: RE | Admit: 2024-03-13 | Discharge: 2024-03-13 | Disposition: A | Discharge status: Home / Self Care | Source: Ambulatory Visit | Attending: Internal Medicine | Admitting: Internal Medicine

## 2024-03-13 DIAGNOSIS — Z1231 Encounter for screening mammogram for malignant neoplasm of breast: Secondary | ICD-10-CM

## 2024-03-28 ENCOUNTER — Encounter: Payer: Self-pay | Admitting: Gastroenterology

## 2024-05-24 ENCOUNTER — Encounter: Payer: Self-pay | Admitting: Gastroenterology

## 2024-05-24 ENCOUNTER — Telehealth: Payer: Self-pay | Admitting: Gastroenterology

## 2024-05-24 ENCOUNTER — Ambulatory Visit (INDEPENDENT_AMBULATORY_CARE_PROVIDER_SITE_OTHER): Admitting: Gastroenterology

## 2024-05-24 VITALS — BP 144/66 | HR 56 | Ht 67.0 in | Wt 203.0 lb

## 2024-05-24 DIAGNOSIS — Z83719 Family history of colon polyps, unspecified: Secondary | ICD-10-CM | POA: Diagnosis not present

## 2024-05-24 DIAGNOSIS — Z860101 Personal history of adenomatous and serrated colon polyps: Secondary | ICD-10-CM

## 2024-05-24 DIAGNOSIS — Z800 Family history of malignant neoplasm of digestive organs: Secondary | ICD-10-CM | POA: Diagnosis not present

## 2024-05-24 DIAGNOSIS — R634 Abnormal weight loss: Secondary | ICD-10-CM | POA: Diagnosis not present

## 2024-05-24 DIAGNOSIS — Z860100 Personal history of colon polyps, unspecified: Secondary | ICD-10-CM

## 2024-05-24 MED ORDER — NA SULFATE-K SULFATE-MG SULF 17.5-3.13-1.6 GM/177ML PO SOLN: 1.0000 | Freq: Once | ORAL | 0 refills | Status: AC

## 2024-05-24 NOTE — Progress Notes (Signed)
 Chief Complaint: colon recall Primary GI Doctor: ( Previously Dr. Savannah Wheeler) Dr. Karene Wheeler  HPI:  Patient is a healthy 83 year old female patient with past medical history of GERD, thyroid  disease, or hypertension, who was referred to me by Vanessa Hasty, DO on 03/07/24 for a complaint of colon recall.    Interval History    Patient presents to discuss colon screening colonoscopy for history of colonic poylps. Patient denies altered bowel habits, abdominal pain, or rectal bleeding. Patient denies GERD or dysphagia. Patient denies nausea, vomiting.  Patient reports 30lb weight loss over last 18 mths. She does report she eats less because she is less active. She states her PCP has evaluated her and "everything normal" including thyroid . Good appetite.   Patient scheduled for knee replacement on July 7th.   Patient's family history includes colon CA and breast CA in her sister age 32s.   Wt Readings from Last 3 Encounters:  05/24/24 203 lb (92.1 kg)  10/31/23 214 lb (97.1 kg)  06/12/20 222 lb (100.7 kg)    Past Medical History:  Diagnosis Date   Arthritis    Blood transfusion without reported diagnosis    GERD (gastroesophageal reflux disease)    Hyperlipidemia    Hypertension    Pneumonia    Thyroid  disease    enlarged thyroid     Past Surgical History:  Procedure Laterality Date   ABDOMINAL HYSTERECTOMY     APPENDECTOMY     CATARACT EXTRACTION, BILATERAL     CHOLECYSTECTOMY OPEN     COLONOSCOPY     DILATION AND CURETTAGE OF UTERUS     LYMPH NODE DISSECTION     right arm   ORIF ANKLE FRACTURE Right 10/31/2023   Procedure: OPEN REDUCTION INTERNAL FIXATION (ORIF) right pilon ANKLE FRACTURE, CLOSED REDUCTION OF FIBLULA;  Surgeon: Vanessa Ink, MD;  Location: [facility] OR;  Service: Orthopedics;  Laterality: Right;  GENERAL AND REGIONAL  60 MIN  OKAY PER DR R PER Vanessa Wheeler   TONSILLECTOMY     tumor right foot      Current Outpatient Medications  Medication Sig Dispense  Refill   acetaminophen  (TYLENOL ) 500 MG tablet Take 1,000 mg by mouth every 6 (six) hours as needed for moderate pain (pain score 4-6).     amLODipine (NORVASC) 5 MG tablet Take 5 mg by mouth daily.     atorvastatin (LIPITOR) 20 MG tablet Take 20 mg by mouth at bedtime.     Cholecalciferol (VITAMIN D3) 50 MCG (2000 UT) capsule Take 2,000 Units by mouth daily.     hydrochlorothiazide (HYDRODIURIL) 25 MG tablet Take 25 mg by mouth daily.     meloxicam (MOBIC) 7.5 MG tablet Take 7.5 mg by mouth daily as needed for pain.     metoprolol succinate (TOPROL-XL) 100 MG 24 hr tablet Take 100 mg by mouth daily. Take with or immediately following a meal.     Na Sulfate-K Sulfate-Mg Sulfate concentrate (SUPREP) 17.5-3.13-1.6 GM/177ML SOLN Take 1 kit (354 mLs total) by mouth once for 1 dose. 354 mL 0   No current facility-administered medications for this visit.    Allergies as of 05/24/2024 - Review Complete 05/24/2024  Allergen Reaction Noted   Codeine  11/14/2009    Family History  Problem Relation Age of Onset   Colon cancer Sister 47   Breast cancer Sister 44   Prostate cancer Brother    Esophageal cancer Neg Hx    Rectal cancer Neg Hx    Stomach cancer Neg  Hx     Review of Systems:    Constitutional: No weight loss, fever, chills, weakness or fatigue HEENT: Eyes: No change in vision               Ears, Nose, Throat:  No change in hearing or congestion Skin: No rash or itching Cardiovascular: No chest pain, chest pressure or palpitations   Respiratory: No SOB or cough Gastrointestinal: See HPI and otherwise negative Genitourinary: No dysuria or change in urinary frequency Neurological: No headache, dizziness or syncope Musculoskeletal: No new muscle or joint pain Hematologic: No bleeding or bruising Psychiatric: No history of depression or anxiety    Physical Exam:  Vital signs: BP (!) 144/66   Pulse (!) 56   Ht 5\' 7"  (1.702 m)   Wt 203 lb (92.1 kg)   BMI 31.79 kg/m    Constitutional:   Pleasant female appears to be in NAD, Well developed, Well nourished, alert and cooperative Throat: Oral cavity and pharynx without inflammation, swelling or lesion.  Respiratory: Respirations even and unlabored. Lungs clear to auscultation bilaterally.   No wheezes, crackles, or rhonchi.  Cardiovascular: Normal S1, S2. Regular rate and rhythm. No peripheral edema, cyanosis or pallor.  Gastrointestinal:  Soft, nondistended, nontender. No rebound or guarding. Normal bowel sounds. No appreciable masses or hepatomegaly. Rectal:  Not performed.  Msk:  Symmetrical without gross deformities. Without edema, no deformity or joint abnormality.  Neurologic:  Alert and  oriented x4;  grossly normal neurologically.  Skin:   Dry and intact without significant lesions or rashes. Psychiatric: Oriented to person, place and time. Demonstrates good judgement and reason without abnormal affect or behaviors.  RELEVANT LABS AND IMAGING: CBC    Latest Ref Rng & Units 10/31/2023    6:01 AM  CBC  WBC 4.0 - 10.5 K/uL 8.9   Hemoglobin 12.0 - 15.0 g/dL 16.1   Hematocrit 09.6 - 46.0 % 37.1   Platelets 150 - 400 K/uL 377      CMP     Latest Ref Rng & Units 10/31/2023    6:01 AM  CMP  Glucose 70 - 99 mg/dL 045   BUN 8 - 23 mg/dL 18   Creatinine 4.09 - 1.00 mg/dL 8.11   Sodium 914 - 782 mmol/L 139   Potassium 3.5 - 5.1 mmol/L 4.5   Chloride 98 - 111 mmol/L 106   CO2 22 - 32 mmol/L 26   Calcium 8.9 - 10.3 mg/dL 9.7   9/56/21 colonoscopy with Dr. Savannah Wheeler, recall 3 years Impression: - One 2 mm polyp in the rectum, removed with a cold snare. Resected and retrieved. - Three 1 to 3 mm polyps in the transverse colon, removed with a cold snare. Resected and retrieved. - Three 1 to 3 mm polyps in the ascending colon, removed with a cold snare. Resected and retrieved. - One less than 1 mm polyp in the cecum, removed with a cold biopsy forceps. Resected and retrieved. - Diverticulosis in the sigmoid  colon. - The examination was otherwise normal on direct and retroflexion views. Path:  Diagnosis Surgical [P], colon, ascending and cecum, transverse, rectal, polyp (8) - TUBULAR ADENOMA(S) WITHOUT HIGH-GRADE DYSPLASIA OR MALIGNANCY - HYPERPLASTIC POLYP 01/24/15 colonoscopy with Dr. Pryor Browning polyp was found in splenic flexure. Sessile polyp was found in the descending colon Mild diverticulosis was noted in sigmoid colon.  Assessment: Encounter Diagnoses  Name Primary?   History of colonic polyps Yes   Family history of colonic polyps  Loss of weight       Healthy 83 year old female patient who presents for screening colonoscopy for history of tubular adenomas and family history of colon CA. She is not currently having any GI issues. Will schedule colonoscopy in [facility] with Dr. Doraine Gallon.     She does report 30lb weight loss, but states she is eating less because she is not as active. We discussed doing endoscopy but she would like to hold off for now. She states she feels well.  Plan: -Schedule for a colonoscopy in [facility] with Dr. Karene Wheeler. The risks and benefits of colonoscopy with possible polypectomy / biopsies were discussed and the patient agrees to proceed.    Thank you for the courtesy of this consult. Please call me with any questions or concerns.   Meng Winterton, FNP-C [facility] 05/24/2024, 11:30 AM  Cc: Vanessa Hasty, DO

## 2024-05-24 NOTE — Patient Instructions (Signed)
 Call us  back to schedule colon.  _______________________________________________________  If your blood pressure at your visit was 140/90 or greater, please contact your primary care physician to follow up on this.  _______________________________________________________  If you are age 83 or older, your body mass index should be between 23-30. Your There is no height or weight on file to calculate BMI. If this is out of the aforementioned range listed, please consider follow up with your Primary Care Provider.  If you are age 74 or younger, your body mass index should be between 19-25. Your There is no height or weight on file to calculate BMI. If this is out of the aformentioned range listed, please consider follow up with your Primary Care Provider.   ________________________________________________________  The  GI providers would like to encourage you to use MYCHART to communicate with providers for non-urgent requests or questions.  Due to long hold times on the telephone, sending your provider a message by [facility] may be a faster and more efficient way to get a response.  Please allow 48 business hours for a response.  Please remember that this is for non-urgent requests.  _______________________________________________________ Thank you for trusting me with your gastrointestinal care. Deanna May, RNP

## 2024-05-24 NOTE — Telephone Encounter (Signed)
 Inbound call from patient, calling denise back to schedule her colonoscopy patient, states she was offered June 9th, however did not state what provider it was with, and no assigned provider listed in office note. Patient would like to speak with denise in regards.

## 2024-06-04 ENCOUNTER — Encounter: Admitting: Gastroenterology

## 2024-06-05 NOTE — H&P (Cosign Needed Addendum)
 TOTAL KNEE ADMISSION H&P  Patient is being admitted for right total knee arthroplasty.  Subjective:  Chief Complaint: Right knee pain.  HPI: Vanessa Wheeler, 83 y.o. female has a history of pain and functional disability in the right knee due to arthritis and has failed non-surgical conservative treatments for greater than 12 weeks to include NSAID's and/or analgesics, corticosteriod injections, and activity modification. Onset of symptoms was gradual, starting several years ago with gradually worsening course since that time. The patient noted no past surgery on the right knee.  Patient currently rates pain in the right knee at 8 out of 10 with activity. Patient has night pain, worsening of pain with activity and weight bearing, and pain that interferes with activities of daily living. Patient has evidence of severe lateral compartment arthritis with bone-on-bone contact and a large valgus deformity in the right knee. Additionally, there is patellofemoral bone-on-bone contact by imaging studies. There is no active infection.  There are no active problems to display for this patient.   Past Medical History:  Diagnosis Date   Arthritis    Blood transfusion without reported diagnosis    GERD (gastroesophageal reflux disease)    Hyperlipidemia    Hypertension    Pneumonia    Thyroid  disease    enlarged thyroid     Past Surgical History:  Procedure Laterality Date   ABDOMINAL HYSTERECTOMY     APPENDECTOMY     CATARACT EXTRACTION, BILATERAL     CHOLECYSTECTOMY OPEN     COLONOSCOPY     DILATION AND CURETTAGE OF UTERUS     LYMPH NODE DISSECTION     right arm   ORIF ANKLE FRACTURE Right 10/31/2023   Procedure: OPEN REDUCTION INTERNAL FIXATION (ORIF) right pilon ANKLE FRACTURE, CLOSED REDUCTION OF FIBLULA;  Surgeon: Barton Drape, MD;  Location: [facility] OR;  Service: Orthopedics;  Laterality: Right;  GENERAL AND REGIONAL  60 MIN  OKAY PER DR R PER TAMMY   TONSILLECTOMY     tumor right  foot      Prior to Admission medications   Medication Sig Start Date End Date Taking? Authorizing Provider  acetaminophen  (TYLENOL ) 500 MG tablet Take 1,000 mg by mouth every 6 (six) hours as needed for moderate pain (pain score 4-6).    [provider]  amLODipine (NORVASC) 5 MG tablet Take 5 mg by mouth daily. 04/18/20   [provider]  atorvastatin (LIPITOR) 20 MG tablet Take 20 mg by mouth at bedtime. 04/30/20   [provider]  Cholecalciferol (VITAMIN D3) 50 MCG (2000 UT) capsule Take 2,000 Units by mouth daily.    [provider]  hydrochlorothiazide (HYDRODIURIL) 25 MG tablet Take 25 mg by mouth daily. 04/30/20   [provider]  meloxicam (MOBIC) 7.5 MG tablet Take 7.5 mg by mouth daily as needed for pain. 10/14/23   [provider]  metoprolol succinate (TOPROL-XL) 100 MG 24 hr tablet Take 100 mg by mouth daily. Take with or immediately following a meal.    [provider]    Allergies  Allergen Reactions   Codeine     passes out    Social History   Socioeconomic History   Marital status: Widowed    Spouse name: Not on file   Number of children: Not on file   Years of education: Not on file   Highest education level: Not on file  Occupational History   Not on file  Tobacco Use   Smoking status: Former  Current packs/day: 0.00    Types: Cigarettes    Quit date: 06/12/1976    Years since quitting: 48.0   Smokeless tobacco: Never  Vaping Use   Vaping status: Never Used  Substance and Sexual Activity   Alcohol use: No    Alcohol/week: 0.0 standard drinks of alcohol   Drug use: No   Sexual activity: Not on file  Other Topics Concern   Not on file  Social History Narrative   Not on file   Social Drivers of Health   Financial Resource Strain: Not on file  Food Insecurity: Not on file  Transportation Needs: Not on file  Physical Activity: Not on file  Stress: Not on file  Social Connections: Not on  file  Intimate Partner Violence: Not on file    Tobacco Use: Medium Risk (05/24/2024)   Patient History    Smoking Tobacco Use: Former    Smokeless Tobacco Use: Never    Passive Exposure: Not on file   Social History   Substance and Sexual Activity  Alcohol Use No   Alcohol/week: 0.0 standard drinks of alcohol    Family History  Problem Relation Age of Onset   Colon cancer Sister 56   Breast cancer Sister 73   Prostate cancer Brother    Esophageal cancer Neg Hx    Rectal cancer Neg Hx    Stomach cancer Neg Hx     ROS  Objective:  Physical Exam: Well nourished and well developed.  General: Alert and oriented x3, cooperative and pleasant, no acute distress.  Head: normocephalic, atraumatic, neck supple.  Eyes: EOMI. Abdomen: non-tender to palpation and soft, normoactive bowel sounds. Musculoskeletal: - Right knee shows a significant valgus deformity.  - Range of motion: 5-130.  - Significant crepitus on range of motion.  - Tender laterally.  - No medial tenderness or instability noted.  - Gait pattern is antalgic on the right. Calves soft and nontender. Motor function intact in LE. Strength 5/5 LE bilaterally. Neuro: Distal pulses 2+. Sensation to light touch intact in LE.  Vital signs in last 24 hours: BP: ()/()  Arterial Line BP: ()/()   Imaging Review Plain radiographs demonstrate severe degenerative joint disease of the right knee. The overall alignment is significant valgus. The bone quality appears to be adequate for age and reported activity level.  Assessment/Plan:  End stage arthritis, right knee   The patient history, physical examination, clinical judgment of the provider and imaging studies are consistent with end stage degenerative joint disease of the right knee and total knee arthroplasty is deemed medically necessary. The treatment options including medical management, injection therapy arthroscopy and arthroplasty were discussed at length. The  risks and benefits of total knee arthroplasty were presented and reviewed. The risks due to aseptic loosening, infection, stiffness, patella tracking problems, thromboembolic complications and other imponderables were discussed. The patient acknowledged the explanation, agreed to proceed with the plan and consent was signed. Patient is being admitted for inpatient treatment for surgery, pain control, PT, OT, prophylactic antibiotics, VTE prophylaxis, progressive ambulation and ADLs and discharge planning. The patient is planning to be discharged home.  Patient's anticipated LOS is less than 2 midnights, meeting these requirements: - Lives within 1 hour of care - Has a competent adult at home to recover with post-op - NO history of  - Chronic pain requiring opiods  - Diabetes  - Coronary Artery Disease  - Heart failure  - Heart attack  - Stroke  - DVT/VTE  -  Cardiac arrhythmia  - Respiratory Failure/COPD  - Renal failure  - Anemia  - Advanced Liver disease  Therapy Plans: EO Disposition: Home with Sister Planned DVT Prophylaxis: Aspirin 81 mg BID DME Needed: RW PCP: Valentin, MD (clearance received) TXA: IV Allergies: codeine (syncope) Anesthesia Concerns: None BMI: 31.6 Last HgbA1c: not diabetic  [facility]: [facility] (deliver to room)  Other: -Does tolerate oxycodone   - Patient was instructed on what medications to stop prior to surgery. - Follow-up visit in 2 weeks with Dr. Melodi - Begin physical therapy following surgery - Pre-operative lab work as pre-surgical testing - Prescriptions will be provided in hospital at time of discharge  R. Zelda Kobs, PA-C Orthopedic Surgery [facility] [place]

## 2024-06-21 NOTE — Patient Instructions (Signed)
 SURGICAL WAITING ROOM VISITATION  Patients having surgery or a procedure may have no more than 2 support people in the waiting area - these visitors may rotate.    Children under the age of 73 must have an adult with them who is not the patient.  Visitors with respiratory illnesses are discouraged from visiting and should remain at home.  If the patient needs to stay at the hospital during part of their recovery, the visitor guidelines for inpatient rooms apply. Pre-op nurse will coordinate an appropriate time for 1 support person to accompany patient in pre-op.  This support person may not rotate.    Please refer to the [facility] website for the visitor guidelines for Inpatients (after your surgery is over and you are in a regular room).       Your procedure is scheduled on: 07/02/24   Report to [facility] Main Entrance    Report to admitting at : 9:00 AM   Call this number if you have problems the morning of surgery [phone]   Do not eat food :After Midnight.   After Midnight you may have the following liquids until : 8:30 AM DAY OF SURGERY  Water Non-Citrus Juices (without pulp, NO RED-Apple, White grape, White cranberry) Black Coffee (NO MILK/CREAM OR CREAMERS, sugar ok)  Clear Tea (NO MILK/CREAM OR CREAMERS, sugar ok) regular and decaf                             Plain Jell-O (NO RED)                                           Fruit ices (not with fruit pulp, NO RED)                                     Popsicles (NO RED)                                                               Sports drinks like Gatorade (NO RED)   The day of surgery:  Drink ONE (1) Pre-Surgery Clear Ensure at : 8:30 AM the morning of surgery. Drink in one sitting. Do not sip.  This drink was given to you during your hospital  pre-op appointment visit. Nothing else to drink after completing the  Pre-Surgery Clear Ensure or G2.          If you have questions, please contact your  surgeon's office.  FOLLOW ANY ADDITIONAL PRE OP INSTRUCTIONS YOU RECEIVED FROM YOUR SURGEON'S OFFICE!!!   Oral Hygiene is also important to reduce your risk of infection.                                    Remember - BRUSH YOUR TEETH THE MORNING OF SURGERY WITH YOUR REGULAR TOOTHPASTE  DENTURES WILL BE REMOVED PRIOR TO SURGERY PLEASE DO NOT APPLY Poly grip OR ADHESIVES!!!   Do NOT smoke after Midnight   Stop all  vitamins and herbal supplements 7 days before surgery.   Take these medicines the morning of surgery with A SIP OF WATER: metoprolol,amlodipine. Tylenol  as needed.                              You may not have any metal on your body including hair pins, jewelry, and body piercing             Do not wear make-up, lotions, powders, perfumes/cologne, or deodorant  Do not wear nail polish including gel and S&S, artificial/acrylic nails, or any other type of covering on natural nails including finger and toenails. If you have artificial nails, gel coating, etc. that needs to be removed by a nail salon please have this removed prior to surgery or surgery may need to be canceled/ delayed if the surgeon/ anesthesia feels like they are unable to be safely monitored.   Do not shave  48 hours prior to surgery.    Do not bring valuables to the hospital. [facility] IS NOT             RESPONSIBLE   FOR VALUABLES.   Contacts, glasses, dentures or bridgework may not be worn into surgery.   Bring small overnight bag day of surgery.   DO NOT BRING YOUR HOME MEDICATIONS TO THE HOSPITAL. PHARMACY WILL DISPENSE MEDICATIONS LISTED ON YOUR MEDICATION LIST TO YOU DURING YOUR ADMISSION IN THE HOSPITAL!    Patients discharged on the day of surgery will not be allowed to drive home.  Someone NEEDS to stay with you for the first 24 hours after anesthesia.   Special Instructions: Bring a copy of your healthcare power of attorney and living will documents the day of surgery if you haven't scanned  them before.              Please read over the following fact sheets you were given: IF YOU HAVE QUESTIONS ABOUT YOUR PRE-OP INSTRUCTIONS PLEASE CALL [phone]   If you received a COVID test during your pre-op visit  it is requested that you wear a mask when out in public, stay away from anyone that may not be feeling well and notify your surgeon if you develop symptoms. If you test positive for Covid or have been in contact with anyone that has tested positive in the last 10 days please notify you surgeon.      Pre-operative 5 CHG Bath Instructions   You can play a key role in reducing the risk of infection after surgery. Your skin needs to be as free of germs as possible. You can reduce the number of germs on your skin by washing with CHG (chlorhexidine  gluconate) soap before surgery. CHG is an antiseptic soap that kills germs and continues to kill germs even after washing.   DO NOT use if you have an allergy to chlorhexidine /CHG or antibacterial soaps. If your skin becomes reddened or irritated, stop using the CHG and notify one of our RNs at [phone].   Please shower with the CHG soap starting 4 days before surgery using the following schedule:     Please keep in mind the following:  DO NOT shave, including legs and underarms, starting the day of your first shower.   You may shave your face at any point before/day of surgery.  Place clean sheets on your bed the day you start using CHG soap. Use a clean washcloth (not used since being washed)  for each shower. DO NOT sleep with pets once you start using the CHG.   CHG Shower Instructions:  If you choose to wash your hair and private area, wash first with your normal shampoo/soap.  After you use shampoo/soap, rinse your hair and body thoroughly to remove shampoo/soap residue.  Turn the water OFF and apply about 3 tablespoons (45 ml) of CHG soap to a CLEAN washcloth.  Apply CHG soap ONLY FROM YOUR NECK DOWN TO YOUR TOES (washing  for 3-5 minutes)  DO NOT use CHG soap on face, private areas, open wounds, or sores.  Pay special attention to the area where your surgery is being performed.  If you are having back surgery, having someone wash your back for you may be helpful. Wait 2 minutes after CHG soap is applied, then you may rinse off the CHG soap.  Pat dry with a clean towel  Put on clean clothes/pajamas   If you choose to wear lotion, please use ONLY the CHG-compatible lotions on the back of this paper.     Additional instructions for the day of surgery: DO NOT APPLY any lotions, deodorants, cologne, or perfumes.   Put on clean/comfortable clothes.  Brush your teeth.  Ask your nurse before applying any prescription medications to the skin.   CHG Compatible Lotions   Aveeno Moisturizing lotion  Cetaphil Moisturizing Cream  Cetaphil Moisturizing Lotion  Clairol Herbal Essence Moisturizing Lotion, Dry Skin  Clairol Herbal Essence Moisturizing Lotion, Extra Dry Skin  Clairol Herbal Essence Moisturizing Lotion, Normal Skin  Curel Age Defying Therapeutic Moisturizing Lotion with Alpha Hydroxy  Curel Extreme Care Body Lotion  Curel Soothing Hands Moisturizing Hand Lotion  Curel Therapeutic Moisturizing Cream, Fragrance-Free  Curel Therapeutic Moisturizing Lotion, Fragrance-Free  Curel Therapeutic Moisturizing Lotion, Original Formula  Eucerin Daily Replenishing Lotion  Eucerin Dry Skin Therapy Plus Alpha Hydroxy Crme  Eucerin Dry Skin Therapy Plus Alpha Hydroxy Lotion  Eucerin Original Crme  Eucerin Original Lotion  Eucerin Plus Crme Eucerin Plus Lotion  Eucerin TriLipid Replenishing Lotion  Keri Anti-Bacterial Hand Lotion  Keri Deep Conditioning Original Lotion Dry Skin Formula Softly Scented  Keri Deep Conditioning Original Lotion, Fragrance Free Sensitive Skin Formula  Keri Lotion Fast Absorbing Fragrance Free Sensitive Skin Formula  Keri Lotion Fast Absorbing Softly Scented Dry Skin Formula  Keri  Original Lotion  Keri Skin Renewal Lotion Keri Silky Smooth Lotion  Keri Silky Smooth Sensitive Skin Lotion  Nivea Body Creamy Conditioning Oil  Nivea Body Extra Enriched Lotion  Nivea Body Original Lotion  Nivea Body Sheer Moisturizing Lotion Nivea Crme  Nivea Skin Firming Lotion  NutraDerm 30 Skin Lotion  NutraDerm Skin Lotion  NutraDerm Therapeutic Skin Cream  NutraDerm Therapeutic Skin Lotion  ProShield Protective Hand Cream  Provon moisturizing lotion   Incentive Spirometer  An incentive spirometer is a tool that can help keep your lungs clear and active. This tool measures how well you are filling your lungs with each breath. Taking long deep breaths may help reverse or decrease the chance of developing breathing (pulmonary) problems (especially infection) following: A long period of time when you are unable to move or be active. BEFORE THE PROCEDURE  If the spirometer includes an indicator to show your best effort, your nurse or respiratory therapist will set it to a desired goal. If possible, sit up straight or lean slightly forward. Try not to slouch. Hold the incentive spirometer in an upright position. INSTRUCTIONS FOR USE  Sit on the edge of your bed  if possible, or sit up as far as you can in bed or on a chair. Hold the incentive spirometer in an upright position. Breathe out normally. Place the mouthpiece in your mouth and seal your lips tightly around it. Breathe in slowly and as deeply as possible, raising the piston or the ball toward the top of the column. Hold your breath for 3-5 seconds or for as long as possible. Allow the piston or ball to fall to the bottom of the column. Remove the mouthpiece from your mouth and breathe out normally. Rest for a few seconds and repeat Steps 1 through 7 at least 10 times every 1-2 hours when you are awake. Take your time and take a few normal breaths between deep breaths. The spirometer may include an indicator to show your best  effort. Use the indicator as a goal to work toward during each repetition. After each set of 10 deep breaths, practice coughing to be sure your lungs are clear. If you have an incision (the cut made at the time of surgery), support your incision when coughing by placing a pillow or rolled up towels firmly against it. Once you are able to get out of bed, walk around indoors and cough well. You may stop using the incentive spirometer when instructed by your caregiver.  RISKS AND COMPLICATIONS Take your time so you do not get dizzy or light-headed. If you are in pain, you may need to take or ask for pain medication before doing incentive spirometry. It is harder to take a deep breath if you are having pain. AFTER USE Rest and breathe slowly and easily. It can be helpful to keep track of a log of your progress. Your caregiver can provide you with a simple table to help with this. If you are using the spirometer at home, follow these instructions: SEEK MEDICAL CARE IF:  You are having difficultly using the spirometer. You have trouble using the spirometer as often as instructed. Your pain medication is not giving enough relief while using the spirometer. You develop fever of 100.5 F (38.1 C) or higher. SEEK IMMEDIATE MEDICAL CARE IF:  You cough up bloody sputum that had not been present before. You develop fever of 102 F (38.9 C) or greater. You develop worsening pain at or near the incision site. MAKE SURE YOU:  Understand these instructions. Will watch your condition. Will get help right away if you are not doing well or get worse. Document Released: 04/25/2007 Document Revised: 03/06/2012 Document Reviewed: 06/26/2007 [facility] Patient Information 2014 [place], [place].   ________________________________________________________________________

## 2024-06-22 ENCOUNTER — Encounter (HOSPITAL_COMMUNITY)
Admission: RE | Admit: 2024-06-22 | Discharge: 2024-06-22 | Disposition: A | Discharge status: Home / Self Care | Source: Ambulatory Visit | Attending: Orthopedic Surgery | Admitting: Orthopedic Surgery

## 2024-06-22 ENCOUNTER — Other Ambulatory Visit: Payer: Self-pay

## 2024-06-22 ENCOUNTER — Encounter (HOSPITAL_COMMUNITY): Payer: Self-pay

## 2024-06-22 VITALS — BP 154/72 | HR 52 | Temp 98.5°F | Ht 66.0 in | Wt 202.0 lb

## 2024-06-22 DIAGNOSIS — I10 Essential (primary) hypertension: Secondary | ICD-10-CM | POA: Diagnosis not present

## 2024-06-22 DIAGNOSIS — Z01812 Encounter for preprocedural laboratory examination: Secondary | ICD-10-CM | POA: Diagnosis present

## 2024-06-22 DIAGNOSIS — Z01818 Encounter for other preprocedural examination: Secondary | ICD-10-CM

## 2024-06-22 LAB — CBC
HCT: 39.9 % (ref 36.0–46.0)
Hemoglobin: 12.3 g/dL (ref 12.0–15.0)
MCH: 29.4 pg (ref 26.0–34.0)
MCHC: 30.8 g/dL (ref 30.0–36.0)
MCV: 95.2 fL (ref 80.0–100.0)
Platelets: 328 10*3/uL (ref 150–400)
RBC: 4.19 MIL/uL (ref 3.87–5.11)
RDW: 13.2 % (ref 11.5–15.5)
WBC: 8.4 10*3/uL (ref 4.0–10.5)
nRBC: 0 % (ref 0.0–0.2)

## 2024-06-22 LAB — BASIC METABOLIC PANEL WITH GFR
Anion gap: 9 (ref 5–15)
BUN: 33 mg/dL — ABNORMAL HIGH (ref 8–23)
CO2: 26 mmol/L (ref 22–32)
Calcium: 9.8 mg/dL (ref 8.9–10.3)
Chloride: 106 mmol/L (ref 98–111)
Creatinine, Ser: 1.45 mg/dL — ABNORMAL HIGH (ref 0.44–1.00)
GFR, Estimated: 36 mL/min — ABNORMAL LOW (ref >60)
Glucose, Bld: 111 mg/dL — ABNORMAL HIGH (ref 70–99)
Potassium: 4.8 mmol/L (ref 3.5–5.1)
Sodium: 141 mmol/L (ref 135–145)

## 2024-06-22 LAB — SURGICAL PCR SCREEN
MRSA, PCR: NEGATIVE
Staphylococcus aureus: NEGATIVE

## 2024-06-22 NOTE — Progress Notes (Addendum)
 For Anesthesia: PCP - Valentin Skates, DO  Cardiologist - N/A  Bowel Prep reminder:  Chest x-ray -  EKG - 02/23/24: Chart. Stress Test -  ECHO -  Cardiac Cath -  Pacemaker/ICD device last checked: Pacemaker orders received: Device Rep notified:  Spinal Cord Stimulator:N/A  Sleep Study - N/A CPAP -   Fasting Blood Sugar - N/A Checks Blood Sugar _____ times a day Date and result of last Hgb A1c-  Last dose of GLP1 agonist- N/A GLP1 instructions:   Last dose of SGLT-2 inhibitors- N/A SGLT-2 instructions:   Blood Thinner Instructions:N/A Aspirin Instructions: Last Dose:  Activity level: Can go up a flight of stairs and activities of daily living without stopping and without chest pain and/or shortness of breath   Able to exercise without chest pain and/or shortness of breath  Anesthesia review: Hx: HTN  Patient denies shortness of breath, fever, cough and chest pain at PAT appointment   Patient verbalized understanding of instructions that were reviewed over the telephone.

## 2024-07-02 ENCOUNTER — Encounter (HOSPITAL_COMMUNITY): Payer: Self-pay | Admitting: Orthopedic Surgery

## 2024-07-02 ENCOUNTER — Ambulatory Visit (HOSPITAL_BASED_OUTPATIENT_CLINIC_OR_DEPARTMENT_OTHER): Admitting: Certified Registered Nurse Anesthetist

## 2024-07-02 ENCOUNTER — Observation Stay (HOSPITAL_COMMUNITY)
Admission: RE | Admit: 2024-07-02 | Discharge: 2024-07-03 | Disposition: A | Discharge status: Home / Self Care | Source: Ambulatory Visit | Attending: Orthopedic Surgery | Admitting: Orthopedic Surgery

## 2024-07-02 ENCOUNTER — Other Ambulatory Visit: Payer: Self-pay

## 2024-07-02 ENCOUNTER — Ambulatory Visit (HOSPITAL_COMMUNITY): Admitting: Certified Registered Nurse Anesthetist

## 2024-07-02 ENCOUNTER — Encounter (HOSPITAL_COMMUNITY)
Admission: RE | Disposition: A | Discharge status: Home / Self Care | Payer: Self-pay | Source: Ambulatory Visit | Attending: Orthopedic Surgery

## 2024-07-02 DIAGNOSIS — Z87891 Personal history of nicotine dependence: Secondary | ICD-10-CM | POA: Diagnosis not present

## 2024-07-02 DIAGNOSIS — Z7982 Long term (current) use of aspirin: Secondary | ICD-10-CM | POA: Diagnosis not present

## 2024-07-02 DIAGNOSIS — M1711 Unilateral primary osteoarthritis, right knee: Secondary | ICD-10-CM

## 2024-07-02 DIAGNOSIS — M179 Osteoarthritis of knee, unspecified: Principal | ICD-10-CM | POA: Diagnosis present

## 2024-07-02 DIAGNOSIS — I10 Essential (primary) hypertension: Secondary | ICD-10-CM | POA: Insufficient documentation

## 2024-07-02 HISTORY — PX: TOTAL KNEE ARTHROPLASTY: SHX125

## 2024-07-02 SURGERY — ARTHROPLASTY, KNEE, TOTAL
Anesthesia: Spinal | Site: Knee | Laterality: Right

## 2024-07-02 MED ORDER — TRAMADOL HCL 50 MG PO TABS
ORAL_TABLET | ORAL | Status: AC
  Filled 2024-07-02: qty 1

## 2024-07-02 MED ORDER — FLEET ENEMA RE ENEM: 1.0000 | ENEMA | Freq: Once | RECTAL | Status: DC | PRN

## 2024-07-02 MED ORDER — OXYCODONE HCL 5 MG PO TABS
5.0000 mg | ORAL_TABLET | ORAL | Status: DC | PRN
  Administered 2024-07-02: 10 mg via ORAL
  Administered 2024-07-03 (×2): 5 mg via ORAL
  Administered 2024-07-03: 10 mg via ORAL
  Filled 2024-07-02 (×3): qty 2
  Filled 2024-07-02: qty 1

## 2024-07-02 MED ORDER — BUPIVACAINE LIPOSOME 1.3 % IJ SUSP
INTRAMUSCULAR | Status: DC | PRN
  Administered 2024-07-02: 80 mL

## 2024-07-02 MED ORDER — METHOCARBAMOL 1000 MG/10ML IJ SOLN: 500.0000 mg | Freq: Four times a day (QID) | INTRAMUSCULAR | Status: DC | PRN

## 2024-07-02 MED ORDER — CEFAZOLIN SODIUM-DEXTROSE 2-4 GM/100ML-% IV SOLN
2.0000 g | Freq: Four times a day (QID) | INTRAVENOUS | Status: AC
  Administered 2024-07-02 (×2): 2 g via INTRAVENOUS
  Filled 2024-07-02 (×2): qty 100

## 2024-07-02 MED ORDER — PHENYLEPHRINE HCL-NACL 20-0.9 MG/250ML-% IV SOLN
INTRAVENOUS | Status: DC | PRN
  Administered 2024-07-02: 25 ug/min via INTRAVENOUS

## 2024-07-02 MED ORDER — LACTATED RINGERS IV SOLN: INTRAVENOUS | Status: DC

## 2024-07-02 MED ORDER — POVIDONE-IODINE 10 % EX SWAB: 2.0000 | Freq: Once | CUTANEOUS | Status: AC

## 2024-07-02 MED ORDER — 0.9 % SODIUM CHLORIDE (POUR BTL) OPTIME
TOPICAL | Status: DC | PRN
  Administered 2024-07-02: 1000 mL

## 2024-07-02 MED ORDER — ORAL CARE MOUTH RINSE: 15.0000 mL | OROMUCOSAL | Status: DC | PRN

## 2024-07-02 MED ORDER — AMLODIPINE BESYLATE 5 MG PO TABS: 5.0000 mg | ORAL_TABLET | Freq: Every day | ORAL | Status: DC

## 2024-07-02 MED ORDER — ACETAMINOPHEN 325 MG PO TABS: 325.0000 mg | ORAL_TABLET | Freq: Four times a day (QID) | ORAL | Status: DC | PRN

## 2024-07-02 MED ORDER — PHENYLEPHRINE 80 MCG/ML (10ML) SYRINGE FOR IV PUSH (FOR BLOOD PRESSURE SUPPORT): PREFILLED_SYRINGE | INTRAVENOUS | Status: DC | PRN

## 2024-07-02 MED ORDER — METOCLOPRAMIDE HCL 5 MG PO TABS: 5.0000 mg | ORAL_TABLET | Freq: Three times a day (TID) | ORAL | Status: DC | PRN

## 2024-07-02 MED ORDER — ONDANSETRON HCL 4 MG PO TABS: 4.0000 mg | ORAL_TABLET | Freq: Four times a day (QID) | ORAL | Status: DC | PRN

## 2024-07-02 MED ORDER — BISACODYL 10 MG RE SUPP: 10.0000 mg | Freq: Every day | RECTAL | Status: DC | PRN

## 2024-07-02 MED ORDER — ONDANSETRON HCL 4 MG/2ML IJ SOLN
INTRAMUSCULAR | Status: DC | PRN
Start: 2024-07-02 — End: 2024-07-02
  Administered 2024-07-02: 4 mg via INTRAVENOUS

## 2024-07-02 MED ORDER — PHENOL 1.4 % MT LIQD: 1.0000 | OROMUCOSAL | Status: DC | PRN

## 2024-07-02 MED ORDER — ROPIVACAINE HCL 5 MG/ML IJ SOLN
INTRAMUSCULAR | Status: DC | PRN
  Administered 2024-07-02: 20 mL via PERINEURAL

## 2024-07-02 MED ORDER — CEFAZOLIN SODIUM-DEXTROSE 2-4 GM/100ML-% IV SOLN
2.0000 g | INTRAVENOUS | Status: AC
  Administered 2024-07-02: 2 g via INTRAVENOUS
  Filled 2024-07-02: qty 100

## 2024-07-02 MED ORDER — ATORVASTATIN CALCIUM 20 MG PO TABS: 20.0000 mg | ORAL_TABLET | Freq: Every day | ORAL | Status: DC

## 2024-07-02 MED ORDER — BUPIVACAINE LIPOSOME 1.3 % IJ SUSP: 20.0000 mL | Freq: Once | INTRAMUSCULAR | Status: AC

## 2024-07-02 MED ORDER — TRAMADOL HCL 50 MG PO TABS
50.0000 mg | ORAL_TABLET | Freq: Four times a day (QID) | ORAL | Status: DC | PRN
  Administered 2024-07-02: 50 mg via ORAL
  Administered 2024-07-02 – 2024-07-03 (×2): 100 mg via ORAL
  Filled 2024-07-02 (×2): qty 2

## 2024-07-02 MED ORDER — SODIUM CHLORIDE 0.9 % IR SOLN
Status: DC | PRN
  Administered 2024-07-02: 1000 mL

## 2024-07-02 MED ORDER — CLONIDINE HCL (ANALGESIA) 100 MCG/ML EP SOLN
EPIDURAL | Status: DC | PRN
  Administered 2024-07-02: 50 ug

## 2024-07-02 MED ORDER — FENTANYL CITRATE PF 50 MCG/ML IJ SOSY: 25.0000 ug | PREFILLED_SYRINGE | INTRAMUSCULAR | Status: DC | PRN

## 2024-07-02 MED ORDER — DOCUSATE SODIUM 100 MG PO CAPS
100.0000 mg | ORAL_CAPSULE | Freq: Two times a day (BID) | ORAL | Status: DC
  Administered 2024-07-02 – 2024-07-03 (×2): 100 mg via ORAL
  Filled 2024-07-02 (×2): qty 1

## 2024-07-02 MED ORDER — METHOCARBAMOL 500 MG PO TABS
ORAL_TABLET | ORAL | Status: AC
  Filled 2024-07-02: qty 1

## 2024-07-02 MED ORDER — HYDROMORPHONE HCL 1 MG/ML IJ SOLN
0.5000 mg | INTRAMUSCULAR | Status: DC | PRN
  Administered 2024-07-02: 0.5 mg via INTRAVENOUS
  Filled 2024-07-02: qty 1

## 2024-07-02 MED ORDER — ACETAMINOPHEN 10 MG/ML IV SOLN
1000.0000 mg | Freq: Four times a day (QID) | INTRAVENOUS | Status: DC
  Administered 2024-07-02: 1000 mg via INTRAVENOUS
  Filled 2024-07-02: qty 100

## 2024-07-02 MED ORDER — HYDROCHLOROTHIAZIDE 25 MG PO TABS
25.0000 mg | ORAL_TABLET | Freq: Every day | ORAL | Status: DC
Start: 2024-07-03 — End: 2024-07-03

## 2024-07-02 MED ORDER — FENTANYL CITRATE PF 50 MCG/ML IJ SOSY
50.0000 ug | PREFILLED_SYRINGE | INTRAMUSCULAR | Status: DC
  Administered 2024-07-02: 50 ug via INTRAVENOUS
  Filled 2024-07-02: qty 2

## 2024-07-02 MED ORDER — SODIUM CHLORIDE 0.9 % IV SOLN: INTRAVENOUS | Status: DC

## 2024-07-02 MED ORDER — BUPIVACAINE IN DEXTROSE 0.75-8.25 % IT SOLN
INTRATHECAL | Status: DC | PRN
  Administered 2024-07-02: 1.6 mL via INTRATHECAL

## 2024-07-02 MED ORDER — DEXAMETHASONE SODIUM PHOSPHATE 10 MG/ML IJ SOLN
10.0000 mg | Freq: Once | INTRAMUSCULAR | Status: AC
  Administered 2024-07-03: 10 mg via INTRAVENOUS
  Filled 2024-07-02: qty 1

## 2024-07-02 MED ORDER — PROPOFOL 500 MG/50ML IV EMUL
INTRAVENOUS | Status: DC | PRN
  Administered 2024-07-02: 100 ug/kg/min via INTRAVENOUS

## 2024-07-02 MED ORDER — MENTHOL 3 MG MT LOZG: 1.0000 | LOZENGE | OROMUCOSAL | Status: DC | PRN

## 2024-07-02 MED ORDER — AMISULPRIDE (ANTIEMETIC) 5 MG/2ML IV SOLN: 10.0000 mg | Freq: Once | INTRAVENOUS | Status: DC | PRN

## 2024-07-02 MED ORDER — CHLORHEXIDINE GLUCONATE 0.12 % MT SOLN
15.0000 mL | Freq: Once | OROMUCOSAL | Status: AC
  Administered 2024-07-02: 15 mL via OROMUCOSAL

## 2024-07-02 MED ORDER — POLYETHYLENE GLYCOL 3350 17 G PO PACK: 17.0000 g | PACK | Freq: Every day | ORAL | Status: DC | PRN

## 2024-07-02 MED ORDER — METHOCARBAMOL 500 MG PO TABS
500.0000 mg | ORAL_TABLET | Freq: Four times a day (QID) | ORAL | Status: DC | PRN
  Administered 2024-07-02 – 2024-07-03 (×2): 500 mg via ORAL
  Filled 2024-07-02 (×2): qty 1

## 2024-07-02 MED ORDER — DEXAMETHASONE SODIUM PHOSPHATE 10 MG/ML IJ SOLN: 8.0000 mg | Freq: Once | INTRAMUSCULAR | Status: AC

## 2024-07-02 MED ORDER — METOPROLOL SUCCINATE ER 50 MG PO TB24: 100.0000 mg | ORAL_TABLET | Freq: Every day | ORAL | Status: DC

## 2024-07-02 MED ORDER — DIPHENHYDRAMINE HCL 12.5 MG/5ML PO ELIX: 12.5000 mg | ORAL_SOLUTION | ORAL | Status: DC | PRN

## 2024-07-02 MED ORDER — METOCLOPRAMIDE HCL 5 MG/ML IJ SOLN: 5.0000 mg | Freq: Three times a day (TID) | INTRAMUSCULAR | Status: DC | PRN

## 2024-07-02 MED ORDER — MIDAZOLAM HCL 2 MG/2ML IJ SOLN: 1.0000 mg | INTRAMUSCULAR | Status: DC

## 2024-07-02 MED ORDER — ASPIRIN 81 MG PO CHEW
81.0000 mg | CHEWABLE_TABLET | Freq: Two times a day (BID) | ORAL | Status: DC
  Administered 2024-07-03: 81 mg via ORAL
  Filled 2024-07-02: qty 1

## 2024-07-02 MED ORDER — ONDANSETRON HCL 4 MG/2ML IJ SOLN: 4.0000 mg | Freq: Four times a day (QID) | INTRAMUSCULAR | Status: DC | PRN

## 2024-07-02 MED ORDER — ORAL CARE MOUTH RINSE: 15.0000 mL | Freq: Once | OROMUCOSAL | Status: AC

## 2024-07-02 MED ORDER — TRANEXAMIC ACID-NACL 1000-0.7 MG/100ML-% IV SOLN
1000.0000 mg | INTRAVENOUS | Status: AC
  Administered 2024-07-02: 1000 mg via INTRAVENOUS
  Filled 2024-07-02: qty 100

## 2024-07-02 MED ORDER — ACETAMINOPHEN 500 MG PO TABS
1000.0000 mg | ORAL_TABLET | Freq: Four times a day (QID) | ORAL | Status: DC
  Administered 2024-07-02 – 2024-07-03 (×3): 1000 mg via ORAL
  Filled 2024-07-02 (×3): qty 2

## 2024-07-02 SURGICAL SUPPLY — 44 items
ATTUNE MED DOME PAT 38 KNEE (Knees) IMPLANT
ATTUNE PS FEM RT SZ 6 CEM KNEE (Femur) IMPLANT
ATTUNE PSRP INSR SZ6 7 KNEE (Insert) IMPLANT
BAG COUNTER SPONGE SURGICOUNT (BAG) IMPLANT
BAG ZIPLOCK 12X15 (MISCELLANEOUS) ×1 IMPLANT
BASE TIBIA ATTUNE KNEE SYS SZ6 (Knees) IMPLANT
BLADE SAG 18X100X1.27 (BLADE) ×1 IMPLANT
BLADE SAW SGTL 11.0X1.19X90.0M (BLADE) ×1 IMPLANT
BNDG ELASTIC 6INX 5YD STR LF (GAUZE/BANDAGES/DRESSINGS) ×1 IMPLANT
BOWL SMART MIX CTS (DISPOSABLE) ×1 IMPLANT
CEMENT HV SMART SET (Cement) ×2 IMPLANT
COVER SURGICAL LIGHT HANDLE (MISCELLANEOUS) ×1 IMPLANT
CUFF TRNQT CYL 34X4.125X (TOURNIQUET CUFF) ×1 IMPLANT
DERMABOND ADVANCED .7 DNX12 (GAUZE/BANDAGES/DRESSINGS) ×1 IMPLANT
DRAPE U-SHAPE 47X51 STRL (DRAPES) ×1 IMPLANT
DRSG AQUACEL AG ADV 3.5X10 (GAUZE/BANDAGES/DRESSINGS) ×1 IMPLANT
DURAPREP 26ML APPLICATOR (WOUND CARE) ×1 IMPLANT
ELECT REM PT RETURN 15FT ADLT (MISCELLANEOUS) ×1 IMPLANT
GLOVE BIO SURGEON STRL SZ 6.5 (GLOVE) IMPLANT
GLOVE BIO SURGEON STRL SZ7 (GLOVE) IMPLANT
GLOVE BIO SURGEON STRL SZ8 (GLOVE) ×1 IMPLANT
GLOVE BIOGEL PI IND STRL 7.0 (GLOVE) ×1 IMPLANT
GLOVE BIOGEL PI IND STRL 8 (GLOVE) ×1 IMPLANT
GOWN STRL REUS W/ TWL LRG LVL3 (GOWN DISPOSABLE) ×1 IMPLANT
HOLDER FOLEY CATH W/STRAP (MISCELLANEOUS) ×1 IMPLANT
IMMOBILIZER KNEE 20 THIGH 36 (SOFTGOODS) ×1 IMPLANT
KIT TURNOVER KIT A (KITS) ×1 IMPLANT
MANIFOLD NEPTUNE II (INSTRUMENTS) ×1 IMPLANT
NS IRRIG 1000ML POUR BTL (IV SOLUTION) ×1 IMPLANT
PACK TOTAL KNEE CUSTOM (KITS) ×1 IMPLANT
PADDING CAST COTTON 6X4 STRL (CAST SUPPLIES) ×2 IMPLANT
PENCIL SMOKE EVACUATOR (MISCELLANEOUS) ×1 IMPLANT
PIN STEINMAN FIXATION KNEE (PIN) IMPLANT
PROTECTOR NERVE ULNAR (MISCELLANEOUS) ×1 IMPLANT
SET HNDPC FAN SPRY TIP SCT (DISPOSABLE) ×1 IMPLANT
SUT MNCRL AB 4-0 PS2 18 (SUTURE) ×1 IMPLANT
SUT VIC AB 2-0 CT1 TAPERPNT 27 (SUTURE) ×3 IMPLANT
SUTURE STRATFX 0 PDS 27 VIOLET (SUTURE) ×1 IMPLANT
TOWEL GREEN STERILE FF (TOWEL DISPOSABLE) ×1 IMPLANT
TRAY FOLEY MTR SLVR 14FR STAT (SET/KITS/TRAYS/PACK) IMPLANT
TRAY FOLEY MTR SLVR 16FR STAT (SET/KITS/TRAYS/PACK) ×1 IMPLANT
TUBE SUCTION HIGH CAP CLEAR NV (SUCTIONS) ×1 IMPLANT
WATER STERILE IRR 1000ML POUR (IV SOLUTION) ×2 IMPLANT
WRAP KNEE MAXI GEL POST OP (GAUZE/BANDAGES/DRESSINGS) ×1 IMPLANT

## 2024-07-02 NOTE — Interval H&P Note (Signed)
 History and Physical Interval Note:  07/02/2024 8:26 AM  Vanessa Wheeler  has presented today for surgery, with the diagnosis of Right knee osteoarthritis.  The various methods of treatment have been discussed with the patient and family. After consideration of risks, benefits and other options for treatment, the patient has consented to  Procedure(s): ARTHROPLASTY, KNEE, TOTAL (Right) as a surgical intervention.  The patient's history has been reviewed, patient examined, no change in status, stable for surgery.  I have reviewed the patient's chart and labs.  Questions were answered to the patient's satisfaction.     Dempsey Audri Kozub

## 2024-07-02 NOTE — Anesthesia Procedure Notes (Signed)
 Anesthesia Regional Block: Adductor canal block   Pre-Anesthetic Checklist: , timeout performed,  Correct Patient, Correct Site, Correct Laterality,  Correct Procedure, Correct Position, site marked,  Risks and benefits discussed,  Surgical consent,  Pre-op evaluation,  At surgeon's request and post-op pain management  Laterality: Right  Prep: chloraprep       Needles:  Injection technique: Single-shot  Needle Type: Echogenic Needle     Needle Length: 9cm  Needle Gauge: 21     Additional Needles:   Procedures:,,,, ultrasound used (permanent image in chart),,    Narrative:  Start time: 07/02/2024 10:50 AM End time: 07/02/2024 10:55 AM Injection made incrementally with aspirations every 5 mL.  Performed by: Personally  Anesthesiologist: Epifanio Charleston, MD

## 2024-07-02 NOTE — Anesthesia Preprocedure Evaluation (Signed)
 Anesthesia Evaluation  Patient identified by MRN, date of birth, ID band Patient awake    Reviewed: Allergy & Precautions, NPO status , Patient's Chart, lab work & pertinent test results  Airway Mallampati: II  TM Distance: >3 FB Neck ROM: Full    Dental  (+) Dental Advisory Given   Pulmonary former smoker   Pulmonary exam normal        Cardiovascular hypertension, Pt. on medications and Pt. on home beta blockers  Rhythm:Regular Rate:Normal     Neuro/Psych negative neurological ROS     GI/Hepatic Neg liver ROS,GERD  ,,  Endo/Other  negative endocrine ROS    Renal/GU Renal InsufficiencyRenal disease     Musculoskeletal  (+) Arthritis ,    Abdominal   Peds  Hematology negative hematology ROS (+)   Anesthesia Other Findings   Reproductive/Obstetrics                              Anesthesia Physical Anesthesia Plan  ASA: 2  Anesthesia Plan: Spinal   Post-op Pain Management: Regional block* and Ofirmev  IV (intra-op)*   Induction:   PONV Risk Score and Plan: 2 and Propofol  infusion, Dexamethasone , Ondansetron  and Treatment may vary due to age or medical condition  Airway Management Planned: Natural Airway and Simple Face Mask  Additional Equipment:   Intra-op Plan:   Post-operative Plan:   Informed Consent: I have reviewed the patients History and Physical, chart, labs and discussed the procedure including the risks, benefits and alternatives for the proposed anesthesia with the patient or authorized representative who has indicated his/her understanding and acceptance.       Plan Discussed with: CRNA  Anesthesia Plan Comments:         Anesthesia Quick Evaluation

## 2024-07-02 NOTE — Transfer of Care (Signed)
 Immediate Anesthesia Transfer of Care Note  Patient: Tilton JAYSON Bohr  Procedure(s) Performed: ARTHROPLASTY, KNEE, TOTAL (Right: Knee)  Patient Location: PACU  Anesthesia Type:MAC, Regional, and Spinal  Level of Consciousness: awake, alert , and oriented  Airway & Oxygen Therapy: Patient Spontanous Breathing and Patient connected to face mask oxygen  Post-op Assessment: Report given to RN and Post -op Vital signs reviewed and stable  Post vital signs: Reviewed and stable  Last Vitals:  Vitals Value Taken Time  BP 113/57 07/02/24 13:07  Temp    Pulse 53 07/02/24 13:09  Resp 14 07/02/24 13:09  SpO2 100 % 07/02/24 13:09  Vitals shown include unfiled device data.  Last Pain:  Vitals:   07/02/24 0914  TempSrc:   PainSc: 0-No pain         Complications: No notable events documented.

## 2024-07-02 NOTE — Anesthesia Procedure Notes (Signed)
 Spinal  Patient location during procedure: OR Start time: 07/02/2024 11:15 AM End time: 07/02/2024 11:20 AM Reason for block: surgical anesthesia Staffing Performed: anesthesiologist  Anesthesiologist: Epifanio Charleston, MD Performed by: Epifanio Charleston, MD Authorized by: Epifanio Charleston, MD   Preanesthetic Checklist Completed: patient identified, IV checked, site marked, risks and benefits discussed, surgical consent, monitors and equipment checked, pre-op evaluation and timeout performed Spinal Block Patient position: sitting Prep: DuraPrep Patient monitoring: heart rate, cardiac monitor, continuous pulse ox and blood pressure Approach: midline Location: L3-4 Injection technique: single-shot Needle Needle type: Pencan  Needle gauge: 24 G Needle length: 9 cm Assessment Sensory level: T4 Events: CSF return

## 2024-07-02 NOTE — Care Plan (Signed)
 Ortho Bundle Case Management Note  Patient Details  Name: Vanessa Wheeler MRN: [id number] Date of Birth: 11/28/1941  RT TKA on 07/02/24  DCP: Home with sister DME: RW, ordered through [facility] PT: EO              DME Arranged:  Walker rolling DME Agency:  [employer]  HH Arranged:    HH Agency:     Additional Comments: Please contact me with any questions of if this plan should need to change.  Burnard Dross, Case Manager [facility] [phone]   Ext. [phone]   07/02/2024, 1:42 PM

## 2024-07-02 NOTE — Discharge Instructions (Signed)
 Ollen Gross, MD Total Joint Specialist [facility] [place] [address] [place], [place] [address] [phone]  TOTAL KNEE REPLACEMENT POSTOPERATIVE DIRECTIONS    Knee Rehabilitation, Guidelines Following Surgery  Results after knee surgery are often greatly improved when you follow the exercise, range of motion and muscle strengthening exercises prescribed by your doctor. Safety measures are also important to protect the knee from further injury. If any of these exercises cause you to have increased pain or swelling in your knee joint, decrease the amount until you are comfortable again and slowly increase them. If you have problems or questions, call your caregiver or physical therapist for advice.   BLOOD CLOT PREVENTION Take an 81 mg Aspirin two times a day for three weeks following surgery. Then take an 81 mg Aspirin once a day for three weeks. Then discontinue Aspirin. You may resume your vitamins/supplements upon discharge from the hospital. Do not take any NSAIDs (Advil, Aleve, Ibuprofen, Meloxicam, etc.) until you have discontinued the 81 mg Aspirin twice a day.  HOME CARE INSTRUCTIONS  Remove items at home which could result in a fall. This includes throw rugs or furniture in walking pathways.  ICE to the affected knee as much as tolerated. Icing helps control swelling. If the swelling is well controlled you will be more comfortable and rehab easier. Continue to use ice on the knee for pain and swelling from surgery. You may notice swelling that will progress down to the foot and ankle. This is normal after surgery. Elevate the leg when you are not up walking on it.    Continue to use the breathing machine which will help keep your temperature down. It is common for your temperature to cycle up and down following surgery, especially at night when you are not up moving around and exerting yourself. The breathing machine keeps your lungs expanded and your  temperature down. Do not place pillow under the operative knee, focus on keeping the knee straight while resting  DIET You may resume your previous home diet once you are discharged from the hospital.  DRESSING / WOUND CARE / SHOWERING Keep your bulky bandage on for 2 days. On the third post-operative day you may remove the Ace bandage and gauze. There is a waterproof adhesive bandage on your skin which will stay in place until your first follow-up appointment. Once you remove this you will not need to place another bandage You may begin showering 3 days following surgery, but do not submerge the incision under water.  ACTIVITY For the first 5 days, the key is rest and control of pain and swelling Do your home exercises twice a day starting on post-operative day 3. On the days you go to physical therapy, just do the home exercises once that day. You should rest, ice and elevate the leg for 50 minutes out of every hour. Get up and walk/stretch for 10 minutes per hour. After 5 days you can increase your activity slowly as tolerated. Walk with your walker as instructed. Use the walker until you are comfortable transitioning to a cane. Walk with the cane in the opposite hand of the operative leg. You may discontinue the cane once you are comfortable and walking steadily. Avoid periods of inactivity such as sitting longer than an hour when not asleep. This helps prevent blood clots.  You may discontinue the knee immobilizer once you are able to perform a straight leg raise while lying down. You may resume a sexual relationship  in one month or when given the OK by your doctor.  You may return to work once you are cleared by your doctor.  Do not drive a car for 6 weeks or until released by your surgeon.  Do not drive while taking narcotics.  TED HOSE STOCKINGS Wear the elastic stockings on both legs for three weeks following surgery during the day. You may remove them at night for sleeping.  WEIGHT  BEARING Weight bearing as tolerated with assist device (walker, cane, etc) as directed, use it as long as suggested by your surgeon or therapist, typically at least 4-6 weeks.  POSTOPERATIVE CONSTIPATION PROTOCOL Constipation - defined medically as fewer than three stools per week and severe constipation as less than one stool per week.  One of the most common issues patients have following surgery is constipation.  Even if you have a regular bowel pattern at home, your normal regimen is likely to be disrupted due to multiple reasons following surgery.  Combination of anesthesia, postoperative narcotics, change in appetite and fluid intake all can affect your bowels.  In order to avoid complications following surgery, here are some recommendations in order to help you during your recovery period.  Colace (docusate) - Pick up an over-the-counter form of Colace or another stool softener and take twice a day as long as you are requiring postoperative pain medications.  Take with a full glass of water daily.  If you experience loose stools or diarrhea, hold the colace until you stool forms back up. If your symptoms do not get better within 1 week or if they get worse, check with your doctor. Dulcolax (bisacodyl) - Pick up over-the-counter and take as directed by the product packaging as needed to assist with the movement of your bowels.  Take with a full glass of water.  Use this product as needed if not relieved by Colace only.  MiraLax (polyethylene glycol) - Pick up over-the-counter to have on hand. MiraLax is a solution that will increase the amount of water in your bowels to assist with bowel movements.  Take as directed and can mix with a glass of water, juice, soda, coffee, or tea. Take if you go more than two days without a movement. Do not use MiraLax more than once per day. Call your doctor if you are still constipated or irregular after using this medication for 7 days in a row.  If you continue  to have problems with postoperative constipation, please contact the office for further assistance and recommendations.  If you experience "the worst abdominal pain ever" or develop nausea or vomiting, please contact the office immediatly for further recommendations for treatment.  ITCHING If you experience itching with your medications, try taking only a single pain pill, or even half a pain pill at a time.  You can also use Benadryl over the counter for itching or also to help with sleep.   MEDICATIONS See your medication summary on the "After Visit Summary" that the nursing staff will review with you prior to discharge.  You may have some home medications which will be placed on hold until you complete the course of blood thinner medication.  It is important for you to complete the blood thinner medication as prescribed by your surgeon.  Continue your approved medications as instructed at time of discharge.  PRECAUTIONS If you experience chest pain or shortness of breath - call 911 immediately for transfer to the hospital emergency department.  If you develop a fever greater  that 101 F, purulent drainage from wound, increased redness or drainage from wound, foul odor from the wound/dressing, or calf pain - CONTACT YOUR SURGEON.                                                   FOLLOW-UP APPOINTMENTS Make sure you keep all of your appointments after your operation with your surgeon and caregivers. You should call the office at the above phone number and make an appointment for approximately two weeks after the date of your surgery or on the date instructed by your surgeon outlined in the "After Visit Summary".  RANGE OF MOTION AND STRENGTHENING EXERCISES  Rehabilitation of the knee is important following a knee injury or an operation. After just a few days of immobilization, the muscles of the thigh which control the knee become weakened and shrink (atrophy). Knee exercises are designed to build up  the tone and strength of the thigh muscles and to improve knee motion. Often times heat used for twenty to thirty minutes before working out will loosen up your tissues and help with improving the range of motion but do not use heat for the first two weeks following surgery. These exercises can be done on a training (exercise) mat, on the floor, on a table or on a bed. Use what ever works the best and is most comfortable for you Knee exercises include:  Leg Lifts - While your knee is still immobilized in a splint or cast, you can do straight leg raises. Lift the leg to 60 degrees, hold for 3 sec, and slowly lower the leg. Repeat 10-20 times 2-3 times daily. Perform this exercise against resistance later as your knee gets better.  Quad and Hamstring Sets - Tighten up the muscle on the front of the thigh (Quad) and hold for 5-10 sec. Repeat this 10-20 times hourly. Hamstring sets are done by pushing the foot backward against an object and holding for 5-10 sec. Repeat as with quad sets.  Leg Slides: Lying on your back, slowly slide your foot toward your buttocks, bending your knee up off the floor (only go as far as is comfortable). Then slowly slide your foot back down until your leg is flat on the floor again. Angel Wings: Lying on your back spread your legs to the side as far apart as you can without causing discomfort.  A rehabilitation program following serious knee injuries can speed recovery and prevent re-injury in the future due to weakened muscles. Contact your doctor or a physical therapist for more information on knee rehabilitation.   POST-OPERATIVE OPIOID TAPER INSTRUCTIONS: It is important to wean off of your opioid medication as soon as possible. If you do not need pain medication after your surgery it is ok to stop day one. Opioids include: Codeine, Hydrocodone(Norco, Vicodin), Oxycodone(Percocet, oxycontin) and hydromorphone amongst others.  Long term and even short term use of opiods can  cause: Increased pain response Dependence Constipation Depression Respiratory depression And more.  Withdrawal symptoms can include Flu like symptoms Nausea, vomiting And more Techniques to manage these symptoms Hydrate well Eat regular healthy meals Stay active Use relaxation techniques(deep breathing, meditating, yoga) Do Not substitute Alcohol to help with tapering If you have been on opioids for less than two weeks and do not have pain than it is ok to stop all  together.  Plan to wean off of opioids This plan should start within one week post op of your joint replacement. Maintain the same interval or time between taking each dose and first decrease the dose.  Cut the total daily intake of opioids by one tablet each day Next start to increase the time between doses. The last dose that should be eliminated is the evening dose.   IF YOU ARE TRANSFERRED TO A SKILLED REHAB FACILITY If the patient is transferred to a skilled rehab facility following release from the hospital, a list of the current medications will be sent to the facility for the patient to continue.  When discharged from the skilled rehab facility, please have the facility set up the patient's Home Health Physical Therapy prior to being released. Also, the skilled facility will be responsible for providing the patient with their medications at time of release from the facility to include their pain medication, the muscle relaxants, and their blood thinner medication. If the patient is still at the rehab facility at time of the two week follow up appointment, the skilled rehab facility will also need to assist the patient in arranging follow up appointment in our office and any transportation needs.  MAKE SURE YOU:  Understand these instructions.  Get help right away if you are not doing well or get worse.   DENTAL ANTIBIOTICS:  In most cases prophylactic antibiotics for Dental procdeures after total joint surgery are  not necessary.  Exceptions are as follows:  1. History of prior total joint infection  2. Severely immunocompromised (Organ Transplant, cancer chemotherapy, Rheumatoid biologic medications such as Humera)  3. Poorly controlled diabetes (A1C &gt; 8.0, blood glucose over 200)  If you have one of these conditions, contact your surgeon for an antibiotic prescription, prior to your dental procedure.    Pick up stool softner and laxative for home use following surgery while on pain medications. Do not submerge incision under water. Please use good hand washing techniques while changing dressing each day. May shower starting three days after surgery. Please use a clean towel to pat the incision dry following showers. Continue to use ice for pain and swelling after surgery. Do not use any lotions or creams on the incision until instructed by your surgeon.

## 2024-07-02 NOTE — Progress Notes (Signed)
 Orthopedic Tech Progress Note Patient Details:  Vanessa Wheeler 1941-08-17 [id number]  CPM Right Knee CPM Right Knee: Off Right Knee Flexion (Degrees): 40 Right Knee Extension (Degrees): 10  Post Interventions Patient Tolerated: Poor  [name] 07/02/2024, 6:02 PM

## 2024-07-02 NOTE — Progress Notes (Signed)
 PT Cancellation Note  Patient Details Name: Vanessa Wheeler MRN: [id number] DOB: 04/01/1941   Cancelled Treatment:    Reason Eval/Treat Not Completed: Fatigue/lethargy limiting ability to participate. PT spoke with nurse whom reported pt had escalated pain s/p removal of CPM this evening. PT arrived 63 and pt sleeping. PT to continue to follow acutely.   [place], PT Acute Rehab   [place] VEAR Drone 07/02/2024, 6:40 PM

## 2024-07-02 NOTE — Anesthesia Postprocedure Evaluation (Signed)
 Anesthesia Post Note  Patient: Vanessa Wheeler  Procedure(s) Performed: ARTHROPLASTY, KNEE, TOTAL (Right: Knee)     Patient location during evaluation: PACU Anesthesia Type: Spinal Level of consciousness: awake and alert Pain management: pain level controlled Vital Signs Assessment: post-procedure vital signs reviewed and stable Respiratory status: spontaneous breathing and respiratory function stable Cardiovascular status: blood pressure returned to baseline and stable Postop Assessment: spinal receding Anesthetic complications: no   No notable events documented.  Last Vitals:  Vitals:   07/02/24 1500 07/02/24 1758  BP: (!) 145/60 132/67  Pulse: (!) 47 (!) 52  Resp: 16 16  Temp: 36.4 C (!) 36.4 C  SpO2:  98%    Last Pain:  Vitals:   07/02/24 1807  TempSrc:   PainSc: 8                  Epifanio Lamar BRAVO

## 2024-07-02 NOTE — Progress Notes (Signed)
 Orthopedic Tech Progress Note Patient Details:  Vanessa Wheeler 10-12-1941 [id number]  CPM Right Knee CPM Right Knee: On Right Knee Flexion (Degrees): 40 Right Knee Extension (Degrees): 10  Post Interventions Patient Tolerated: Well  Vanessa Wheeler Vanessa Wheeler 07/02/2024, 1:47 PM

## 2024-07-02 NOTE — Op Note (Signed)
 OPERATIVE REPORT-TOTAL KNEE ARTHROPLASTY   Pre-operative diagnosis- Osteoarthritis  Right knee(s)  Post-operative diagnosis- Osteoarthritis Right knee(s)  Procedure-  Right  Total Knee Arthroplasty  Surgeon- Dempsey GAILS. Aylee Littrell, MD  Assistant- Corean Sender, PA-C   Anesthesia-  Adductor canal block and spinal  EBL-50 mL   Drains None  Tourniquet time-  Total Tourniquet Time Documented: Thigh (Right) - 35 minutes Total: Thigh (Right) - 35 minutes     Complications- None  Condition-PACU - hemodynamically stable.   Brief Clinical Note  Vanessa Wheeler is a 83 y.o. year old female with end stage OA of her right knee with progressively worsening pain and dysfunction. She has constant pain, with activity and at rest and significant functional deficits with difficulties even with ADLs. She has had extensive non-op management including analgesics, injections of cortisone and viscosupplements, and home exercise program, but remains in significant pain with significant dysfunction.Radiographs show bone on bone arthritis lateral and patellofemoral. She presents now for right Total Knee Arthroplasty.     Procedure in detail---   The patient is brought into the operating room and positioned supine on the operating table. After successful administration of  Adductor canal block and spinal,   a tourniquet is placed high on the  Right thigh(s) and the lower extremity is prepped and draped in the usual sterile fashion. Time out is performed by the operating team and then the  Right lower extremity is wrapped in Esmarch, knee flexed and the tourniquet inflated to 300 mmHg.       A midline incision is made with a ten blade through the subcutaneous tissue to the level of the extensor mechanism. A fresh blade is used to make a medial parapatellar arthrotomy. Soft tissue over the proximal medial tibia is subperiosteally elevated to the joint line with a knife and into the semimembranosus bursa  with a Cobb elevator. Soft tissue over the proximal lateral tibia is elevated with attention being paid to avoiding the patellar tendon on the tibial tubercle. The patella is everted, knee flexed 90 degrees and the ACL and PCL are removed. Findings are bone on bone lateral and patellofemoral with large global osteophytes        The drill is used to create a starting hole in the distal femur and the canal is thoroughly irrigated with sterile saline to remove the fatty contents. The 5 degree Right  valgus alignment guide is placed into the femoral canal and the distal femoral cutting block is pinned to remove 10 mm off the distal femur. Resection is made with an oscillating saw.      The tibia is subluxed forward and the menisci are removed. The extramedullary alignment guide is placed referencing proximally at the medial aspect of the tibial tubercle and distally along the second metatarsal axis and tibial crest. The block is pinned to remove 2mm off the more deficient lateral  side. Resection is made with an oscillating saw. Size 6is the most appropriate size for the tibia and the proximal tibia is prepared with the modular drill and keel punch for that size.      The femoral sizing guide is placed and size 6 is most appropriate. Rotation is marked off the epicondylar axis and confirmed by creating a rectangular flexion gap at 90 degrees. The size 6 cutting block is pinned in this rotation and the anterior, posterior and chamfer cuts are made with the oscillating saw. The intercondylar block is then placed and that cut is made.  Trial size 6 tibial component, trial size 6 posterior stabilized femur and a 7  mm posterior stabilized rotating platform insert trial is placed. Full extension is achieved with excellent varus/valgus and anterior/posterior balance throughout full range of motion. The patella is everted and thickness measured to be 22  mm. Free hand resection is taken to 12 mm, a 35 template is  placed, lug holes are drilled, trial patella is placed, and it tracks normally. Osteophytes are removed off the posterior femur with the trial in place. All trials are removed and the cut bone surfaces prepared with pulsatile lavage. Cement is mixed and once ready for implantation, the size 6 tibial implant, size  6 posterior stabilized femoral component, and the size 35 patella are cemented in place and the patella is held with the clamp. The trial insert is placed and the knee held in full extension. The Exparel  (20 ml mixed with 60 ml saline) is injected into the extensor mechanism, posterior capsule, medial and lateral gutters and subcutaneous tissues.  All extruded cement is removed and once the cement is hard the permanent 7 mm posterior stabilized rotating platform insert is placed into the tibial tray.      The wound is copiously irrigated with saline solution and the extensor mechanism closed with # 0 Stratofix suture. The tourniquet is released for a total tourniquet time of 35  minutes. Flexion against gravity is 140 degrees and the patella tracks normally. Subcutaneous tissue is closed with 2.0 vicryl and subcuticular with running 4.0 Monocryl. The incision is cleaned and dried and steri-strips and a bulky sterile dressing are applied. The limb is placed into a knee immobilizer and the patient is awakened and transported to recovery in stable condition.      Please note that a surgical assistant was a medical necessity for this procedure in order to perform it in a safe and expeditious manner. Surgical assistant was necessary to retract the ligaments and vital neurovascular structures to prevent injury to them and also necessary for proper positioning of the limb to allow for anatomic placement of the prosthesis.   Dempsey ROCKFORD Kalynn Declercq, MD    07/02/2024, 12:25 PM

## 2024-07-03 ENCOUNTER — Other Ambulatory Visit (HOSPITAL_COMMUNITY): Payer: Self-pay

## 2024-07-03 ENCOUNTER — Encounter (HOSPITAL_COMMUNITY): Payer: Self-pay | Admitting: Orthopedic Surgery

## 2024-07-03 DIAGNOSIS — M1711 Unilateral primary osteoarthritis, right knee: Secondary | ICD-10-CM | POA: Diagnosis not present

## 2024-07-03 LAB — BASIC METABOLIC PANEL WITH GFR
Anion gap: 8 (ref 5–15)
BUN: 20 mg/dL (ref 8–23)
CO2: 26 mmol/L (ref 22–32)
Calcium: 8.4 mg/dL — ABNORMAL LOW (ref 8.9–10.3)
Chloride: 106 mmol/L (ref 98–111)
Creatinine, Ser: 1 mg/dL (ref 0.44–1.00)
GFR, Estimated: 56 mL/min — ABNORMAL LOW (ref >60)
Glucose, Bld: 106 mg/dL — ABNORMAL HIGH (ref 70–99)
Potassium: 3.9 mmol/L (ref 3.5–5.1)
Sodium: 140 mmol/L (ref 135–145)

## 2024-07-03 LAB — CBC
HCT: 30.4 % — ABNORMAL LOW (ref 36.0–46.0)
Hemoglobin: 9.5 g/dL — ABNORMAL LOW (ref 12.0–15.0)
MCH: 29.8 pg (ref 26.0–34.0)
MCHC: 31.3 g/dL (ref 30.0–36.0)
MCV: 95.3 fL (ref 80.0–100.0)
Platelets: 247 K/uL (ref 150–400)
RBC: 3.19 MIL/uL — ABNORMAL LOW (ref 3.87–5.11)
RDW: 13.3 % (ref 11.5–15.5)
WBC: 8.7 K/uL (ref 4.0–10.5)
nRBC: 0 % (ref 0.0–0.2)

## 2024-07-03 MED ORDER — TRAMADOL HCL 50 MG PO TABS
50.0000 mg | ORAL_TABLET | Freq: Four times a day (QID) | ORAL | 0 refills | Status: AC | PRN
  Filled 2024-07-03: qty 40, 5d supply, fill #0

## 2024-07-03 MED ORDER — ONDANSETRON HCL 4 MG PO TABS
4.0000 mg | ORAL_TABLET | Freq: Four times a day (QID) | ORAL | 0 refills | Status: AC | PRN
  Filled 2024-07-03: qty 20, 5d supply, fill #0

## 2024-07-03 MED ORDER — ASPIRIN 81 MG PO CHEW
81.0000 mg | CHEWABLE_TABLET | Freq: Two times a day (BID) | ORAL | 0 refills | Status: AC
  Filled 2024-07-03: qty 63, 42d supply, fill #0

## 2024-07-03 MED ORDER — OXYCODONE HCL 5 MG PO TABS
5.0000 mg | ORAL_TABLET | Freq: Four times a day (QID) | ORAL | 0 refills | Status: AC | PRN
  Filled 2024-07-03: qty 42, 6d supply, fill #0

## 2024-07-03 MED ORDER — SODIUM CHLORIDE 0.9 % IV BOLUS
250.0000 mL | Freq: Once | INTRAVENOUS | Status: AC
  Administered 2024-07-03: 250 mL via INTRAVENOUS

## 2024-07-03 MED ORDER — METHOCARBAMOL 500 MG PO TABS
500.0000 mg | ORAL_TABLET | Freq: Four times a day (QID) | ORAL | 0 refills | Status: AC | PRN
Start: 2024-07-03 — End: ?
  Filled 2024-07-03: qty 40, 10d supply, fill #0

## 2024-07-03 NOTE — Progress Notes (Signed)
 Discharge instructions given to patient questions asked and answered. Sylvia Everts RN

## 2024-07-03 NOTE — Care Management Obs Status (Deleted)
 MEDICARE OBSERVATION STATUS NOTIFICATION   Patient Details  Name: Vanessa Wheeler MRN: [id number] Date of Birth: Sep 25, 1941   Medicare Observation Status Notification Given:  Yes    Alfonse JONELLE Rex, RN 07/03/2024, 10:07 AM

## 2024-07-03 NOTE — Progress Notes (Signed)
 Agree with the assessment and plan as outlined by [facility], FNP-C.  Carlitos Bottino, DO, [facility]

## 2024-07-03 NOTE — Progress Notes (Addendum)
   Subjective: 1 Day Post-Op Procedure(s) (LRB): ARTHROPLASTY, KNEE, TOTAL (Right) Patient reports pain as mild.   Patient seen in rounds by Dr. Melodi. Patient is doing well this morning. No issues overnight. Denies chest pain, SOB, or calf pain. Foley catheter removed this AM.  We will begin therapy today  Objective: Vital signs in last 24 hours: Temp:  [97.5 F (36.4 C)-98.4 F (36.9 C)] 98.4 F (36.9 C) (07/08 0528) Pulse Rate:  [47-55] 55 (07/08 0528) Resp:  [14-19] 18 (07/08 0528) BP: (111-145)/(46-67) 111/54 (07/08 0528) SpO2:  [95 %-100 %] 95 % (07/08 0528) FiO2 (%):  [21 %] 21 % (07/07 1456) Weight:  [91 kg] 91 kg (07/07 0914)  Intake/Output from previous day:  Intake/Output Summary (Last 24 hours) at 07/03/2024 0853 Last data filed at 07/03/2024 0528 Gross per 24 hour  Intake 2358.3 ml  Output 1250 ml  Net 1108.3 ml     Intake/Output this shift: No intake/output data recorded.  Labs: Recent Labs    07/03/24 0345  HGB 9.5*   Recent Labs    07/03/24 0345  WBC 8.7  RBC 3.19*  HCT 30.4*  PLT 247   Recent Labs    07/03/24 0345  NA 140  K 3.9  CL 106  CO2 26  BUN 20  CREATININE 1.00  GLUCOSE 106*  CALCIUM  8.4*   No results for input(s): LABPT, INR in the last 72 hours.  Exam: General - Patient is Alert and Oriented Extremity - Neurologically intact Neurovascular intact Sensation intact distally Dorsiflexion/Plantar flexion intact Dressing - dressing C/D/I Motor Function - intact, moving foot and toes well on exam.   Past Medical History:  Diagnosis Date   Arthritis    Blood transfusion without reported diagnosis    GERD (gastroesophageal reflux disease)    Hyperlipidemia    Hypertension    Pneumonia    Pneumothorax 1957   Thyroid  disease    enlarged thyroid     Assessment/Plan: 1 Day Post-Op Procedure(s) (LRB): ARTHROPLASTY, KNEE, TOTAL (Right) Principal Problem:   OA (osteoarthritis) of knee Active Problems:    Osteoarthritis of right knee  Estimated body mass index is 32.38 kg/m as calculated from the following:   Height as of this encounter: 5' 6 (1.676 m).   Weight as of this encounter: 91 kg. Advance diet Up with therapy D/C IV fluids   Patient's anticipated LOS is less than 2 midnights, meeting these requirements: - Lives within 1 hour of care - Has a competent adult at home to recover with post-op recover - NO history of  - Chronic pain requiring opiods  - Diabetes  - Coronary Artery Disease  - Heart failure  - Heart attack  - Stroke  - DVT/VTE  - Cardiac arrhythmia  - Respiratory Failure/COPD  - Renal failure  - Anemia  - Advanced Liver disease     DVT Prophylaxis - Aspirin  Weight bearing as tolerated. Continue therapy.  Plan is to go Home after hospital stay.  Given age and preop valgus deformity may possibly stay additional night to maximize mobility. Will depend on how she progresses with therapy today.  Scheduled for OPPT at [facility]. Follow-up in the office in 2 weeks.  The PDMP database was reviewed today prior to any opioid medications being prescribed to this patient.  Roxie Mess, PA-C Orthopedic Surgery [phone] 07/03/2024, 8:53 AM

## 2024-07-03 NOTE — TOC Transition Note (Signed)
 Transition of Care [facility]) - Discharge Note   Patient Details  Name: Vanessa Wheeler MRN: [id number] Date of Birth: Jul 07, 1941  Transition of Care [facility]) CM/SW Contact:  Alfonse JONELLE Rex, RN Phone Number: 07/03/2024, 12:39 PM   Clinical Narrative:   Met with patient at bedside to review dc therapy and home equipment needs, pt confirmed OPPT  EO, RW delivered to bedside by [employer]. MOON completed. No TOC needs.     Final next level of care: OP Rehab     Patient Goals and CMS Choice            Discharge Placement                       Discharge Plan and Services Additional resources added to the After Visit Summary for                  DME Arranged: Walker rolling DME Agency: [employer]                  Social Drivers of Health (SDOH) Interventions SDOH Screenings   Food Insecurity: No Food Insecurity (07/02/2024)  Housing: Low Risk  (07/02/2024)  Transportation Needs: No Transportation Needs (07/02/2024)  Utilities: Not At Risk (07/02/2024)  Social Connections: Moderately Isolated (07/02/2024)  Tobacco Use: Medium Risk (07/02/2024)     Readmission Risk Interventions     No data to display

## 2024-07-03 NOTE — Progress Notes (Signed)
 TOC meds in a secure bag delivered to pt in room by this RN.

## 2024-07-03 NOTE — Care Management Obs Status (Signed)
 MEDICARE OBSERVATION STATUS NOTIFICATION   Patient Details  Name: Vanessa Wheeler MRN: [id number] Date of Birth: 05-17-1941   Medicare Observation Status Notification Given:       Alfonse JONELLE Rex, RN 07/03/2024, 10:01 AM

## 2024-07-03 NOTE — Plan of Care (Signed)
  Problem: Pain Management: Goal: Pain level will decrease with appropriate interventions Outcome: Progressing   Problem: Activity: Goal: Range of joint motion will improve Outcome: Progressing   Problem: Clinical Measurements: Goal: Postoperative complications will be avoided or minimized Outcome: Progressing   Problem: Clinical Measurements: Goal: Ability to maintain clinical measurements within normal limits will improve Outcome: Progressing   Problem: Safety: Goal: Ability to remain free from injury will improve Outcome: Progressing

## 2024-07-03 NOTE — Evaluation (Signed)
 Physical Therapy Evaluation Patient Details Name: Vanessa Wheeler MRN: [id number] DOB: 1941-05-26 Today's Date: 07/03/2024  History of Present Illness  83 yo female presents to therapy s/p R TKA on 07/02/2024 due to failure of conservative measures. Pt PMH includes but is not limited to: arthritis, GERD,  HLD, HTN, thyroid  dz, and R ankle ORIF (2024).  Clinical Impression  Pt is s/p TKA resulting in the deficits listed below (see PT Problem List). Pt in bed, agreeable to PT session. She states that her sister will be present to help following surgery. She has equipment at home following R ankle ORIF in 2024. She is able to come to sit and Sit to Stand without physical assist but requires cues and SBA. Pt amb to and from the bathroom at [facility] with RW and IV pole follow. Decreased activity tolerance this morning with some light headedness noted, resolves with rest and deep breathing. Pt returns to bed for some rest, will follow up. Pt will benefit from acute skilled PT to increase their independence and safety with mobility to allow discharge.          If plan is discharge home, recommend the following: A little help with walking and/or transfers;A little help with bathing/dressing/bathroom;Assistance with cooking/housework;Assist for transportation;Help with stairs or ramp for entrance   Can travel by private vehicle        Equipment Recommendations None recommended by PT  Recommendations for Other Services       Functional Status Assessment Patient has had a recent decline in their functional status and demonstrates the ability to make significant improvements in function in a reasonable and predictable amount of time.     Precautions / Restrictions Precautions Precautions: Fall Recall of Precautions/Restrictions: Intact Restrictions Weight Bearing Restrictions Per Provider Order: Yes RLE Weight Bearing Per Provider Order: Weight bearing as tolerated      Mobility  Bed  Mobility Overal bed mobility: Needs Assistance Bed Mobility: Supine to Sit, Sit to Sidelying     Supine to sit: Supervision   Sit to sidelying: Supervision General bed mobility comments: inc time, cues for sequencing    Transfers Overall transfer level: Needs assistance Equipment used: Rolling walker (2 wheels) Transfers: Sit to/from Stand Sit to Stand: Contact guard assist           General transfer comment: inc time and significant trunk flexion during transfer    Ambulation/Gait Ambulation/Gait assistance: Contact guard assist Gait Distance (Feet): 20 Feet Assistive device: Rolling walker (2 wheels) Gait Pattern/deviations: Step-to pattern, Decreased stance time - right, Wide base of support, Antalgic Gait velocity: dec     General Gait Details: pt uses step to pattern in room, able to retrostep to open bathroom door, requires CGA and IV pole follow  Stairs            Wheelchair Mobility     Tilt Bed    Modified Rankin (Stroke Patients Only)       Balance Overall balance assessment: Needs assistance Sitting-balance support: Feet supported, No upper extremity supported Sitting balance-Leahy Scale: Fair     Standing balance support: Single extremity supported, Reliant on assistive device for balance Standing balance-Leahy Scale: Fair                               Pertinent Vitals/Pain Pain Assessment Pain Assessment: Faces Faces Pain Scale: Hurts even more Pain Location: R knee Pain Descriptors / Indicators: Aching, Discomfort, Sore,  Sharp Pain Intervention(s): Premedicated before session, Limited activity within patient's tolerance, Monitored during session, Repositioned    Home Living Family/patient expects to be discharged to:: Private residence Living Arrangements: Alone Available Help at Discharge: Family;Available 24 hours/day Type of Home: House Home Access: Ramped entrance       Home Layout: One level Home Equipment:  Agricultural consultant (2 wheels);Cane - single point;Wheelchair - manual;Toilet riser      Prior Function Prior Level of Function : Independent/Modified Independent                     Extremity/Trunk Assessment   Upper Extremity Assessment Upper Extremity Assessment: Overall WFL for tasks assessed    Lower Extremity Assessment Lower Extremity Assessment: RLE deficits/detail RLE Deficits / Details: limitation in WB tolerance and activity tolerance this session, able to compensate with walker    Cervical / Trunk Assessment Cervical / Trunk Assessment: Normal  Communication   Communication Communication: No apparent difficulties    Cognition Arousal: Alert Behavior During Therapy: WFL for tasks assessed/performed   PT - Cognitive impairments: No apparent impairments                         Following commands: Intact       Cueing Cueing Techniques: Verbal cues, Gestural cues     General Comments General comments (skin integrity, edema, etc.): dressing CDI    Exercises     Assessment/Plan    PT Assessment Patient needs continued PT services  PT Problem List Decreased strength;Decreased balance;Decreased range of motion;Decreased mobility;Decreased activity tolerance       PT Treatment Interventions DME instruction;Functional mobility training;Balance training;Patient/family education;Gait training;Therapeutic activities;Therapeutic exercise    PT Goals (Current goals can be found in the Care Plan section)  Acute Rehab PT Goals Patient Stated Goal: decrease pain PT Goal Formulation: With patient Time For Goal Achievement: 07/17/24 Potential to Achieve Goals: Good    Frequency 7X/week     Co-evaluation               AM-PAC PT 6 Clicks Mobility  Outcome Measure Help needed turning from your back to your side while in a flat bed without using bedrails?: A Little Help needed moving from lying on your back to sitting on the side of a flat  bed without using bedrails?: A Little Help needed moving to and from a bed to a chair (including a wheelchair)?: A Little Help needed standing up from a chair using your arms (e.g., wheelchair or bedside chair)?: A Little Help needed to walk in hospital room?: A Little Help needed climbing 3-5 steps with a railing? : A Lot 6 Click Score: 17    End of Session Equipment Utilized During Treatment: Gait belt Activity Tolerance: Patient limited by fatigue (light headed) Patient left: in bed;with bed alarm set;with call bell/phone within reach Nurse Communication: Mobility status PT Visit Diagnosis: Unsteadiness on feet (R26.81);Difficulty in walking, not elsewhere classified (R26.2);Pain Pain - Right/Left: Right Pain - part of body: Knee    Time: 1002-1026 PT Time Calculation (min) (ACUTE ONLY): 24 min   Charges:   PT Evaluation $PT Eval Low Complexity: 1 Low PT Treatments $Gait Training: 8-22 mins PT General Charges $$ ACUTE PT VISIT: 1 Visit         Stann, PT Acute Rehabilitation Services Office: [phone] 07/03/2024   Stann DELENA Ohara 07/03/2024, 10:55 AM

## 2024-07-03 NOTE — Progress Notes (Signed)
 Physical Therapy Treatment Patient Details Name: Vanessa Wheeler MRN: [id number] DOB: [birth year] Today's Date: 07/03/2024   History of Present Illness 83 yo female presents to therapy s/p R TKA on 07/02/2024 due to failure of conservative measures. Pt PMH includes but is not limited to: arthritis, GERD,  HLD, HTN, thyroid  dz, and R ankle ORIF (2024).    PT Comments  Pt agreeable to second session, reports that she was able to nap and is feeling better. Pt assessed for orthostatics (see below) remains asymptomatic. IV site bleeding, nursing notified and corrected. Pt able to complete trip to the bathroom and amb into the hallway to mimic home distances and setup. Pt reports feeling warm with mobility this session, returns to recliner, provided wash cloth which improves sensation. Pt reviews HEP with good understanding. No further needs at this time.   BP supine: 155/62, 62 bpm BP sitting: 126/52, 62 bpm BP standing: 126/63, 64bpm    If plan is discharge home, recommend the following: A little help with walking and/or transfers;A little help with bathing/dressing/bathroom;Assistance with cooking/housework;Assist for transportation;Help with stairs or ramp for entrance   Can travel by private vehicle        Equipment Recommendations  None recommended by PT    Recommendations for Other Services       Precautions / Restrictions       Mobility  Bed Mobility Overal bed mobility: Needs Assistance Bed Mobility: Supine to Sit     Supine to sit: Supervision   Sit to sidelying: Supervision General bed mobility comments: inc time, cues for sequencing    Transfers Overall transfer level: Needs assistance Equipment used: Rolling walker (2 wheels) Transfers: Sit to/from Stand Sit to Stand: Supervision           General transfer comment: improved tolerance and progressive upright trunk position    Ambulation/Gait Ambulation/Gait assistance: Supervision Gait Distance (Feet): 75  Feet Assistive device: Rolling walker (2 wheels) Gait Pattern/deviations: Step-to pattern, Decreased stance time - right, Wide base of support, Antalgic Gait velocity: dec     General Gait Details: Pt able to complete more fluid steps today, uses step to pattern, walker height adjusted for efficiency   Stairs             Wheelchair Mobility     Tilt Bed    Modified Rankin (Stroke Patients Only)       Balance Overall balance assessment: Needs assistance Sitting-balance support: Feet supported, No upper extremity supported Sitting balance-Leahy Scale: Fair     Standing balance support: Reliant on assistive device for balance, No upper extremity supported Standing balance-Leahy Scale: Fair                              Hotel manager: No apparent difficulties  Cognition Arousal: Alert Behavior During Therapy: WFL for tasks assessed/performed   PT - Cognitive impairments: No apparent impairments                         Following commands: Intact      Cueing Cueing Techniques: Verbal cues, Gestural cues  Exercises Total Joint Exercises Ankle Circles/Pumps: AROM, 10 reps, Right Quad Sets: AROM, 10 reps, Right Heel Slides: AROM, 10 reps, Right Hip ABduction/ADduction: AROM, 10 reps, Right Straight Leg Raises: AROM, 10 reps, Right Long Arc Quad: AROM, 10 reps, Right Knee Flexion: AROM, 10 reps, Right    General Comments General  comments (skin integrity, edema, etc.): orthostatics completed      Pertinent Vitals/Pain Pain Assessment Pain Assessment: Faces Faces Pain Scale: Hurts even more Pain Location: R knee Pain Descriptors / Indicators: Aching, Discomfort, Sore, Sharp Pain Intervention(s): Limited activity within patient's tolerance, Monitored during session, Repositioned    Home Living                          Prior Function            PT Goals (current goals can now be found in the  care plan section) Acute Rehab PT Goals Patient Stated Goal: decrease pain PT Goal Formulation: With patient Time For Goal Achievement: 07/17/24 Potential to Achieve Goals: Good Progress towards PT goals: Progressing toward goals    Frequency    7X/week      PT Plan      Co-evaluation              AM-PAC PT 6 Clicks Mobility   Outcome Measure  Help needed turning from your back to your side while in a flat bed without using bedrails?: None Help needed moving from lying on your back to sitting on the side of a flat bed without using bedrails?: None Help needed moving to and from a bed to a chair (including a wheelchair)?: A Little Help needed standing up from a chair using your arms (e.g., wheelchair or bedside chair)?: A Little Help needed to walk in hospital room?: A Little Help needed climbing 3-5 steps with a railing? : A Lot 6 Click Score: 19    End of Session Equipment Utilized During Treatment: Gait belt Activity Tolerance: Patient tolerated treatment well (reports feeling warm with amb) Patient left: in bed;with bed alarm set;with call bell/phone within reach Nurse Communication: Mobility status PT Visit Diagnosis: Unsteadiness on feet (R26.81);Difficulty in walking, not elsewhere classified (R26.2);Pain Pain - Right/Left: Right Pain - part of body: Knee     Time: 8560-8485 PT Time Calculation (min) (ACUTE ONLY): 35 min  Charges:    $Gait Training: 23-37 mins PT General Charges $$ ACUTE PT VISIT: 1 Visit                     Vanessa, PT Acute Rehabilitation Services Office: [phone] 07/03/2024    Vanessa Wheeler 07/03/2024, 3:21 PM

## 2024-07-10 NOTE — Discharge Summary (Signed)
 Patient ID: Vanessa Wheeler MRN: [id number] DOB/AGE: 1941-04-04 83 y.o.  Admit date: 07/02/2024 Discharge date: 07/03/2024  Admission Diagnoses:  Principal Problem:   OA (osteoarthritis) of knee Active Problems:   Osteoarthritis of right knee   Discharge Diagnoses:  Same  Past Medical History:  Diagnosis Date   Arthritis    Blood transfusion without reported diagnosis    GERD (gastroesophageal reflux disease)    Hyperlipidemia    Hypertension    Pneumonia    Pneumothorax 1957   Thyroid  disease    enlarged thyroid     Surgeries: Procedure(s): ARTHROPLASTY, KNEE, TOTAL on 07/02/2024   Consultants:   Discharged Condition: Improved  Hospital Course: LORMA HEATER is an 84 y.o. female who was admitted 07/02/2024 for operative treatment ofOA (osteoarthritis) of knee. Patient has severe unremitting pain that affects sleep, daily activities, and work/hobbies. After pre-op clearance the patient was taken to the operating room on 07/02/2024 and underwent  Procedure(s): ARTHROPLASTY, KNEE, TOTAL.    Patient was given perioperative antibiotics:  Anti-infectives (From admission, onward)    Start     Dose/Rate Route Frequency Ordered Stop   07/02/24 1730  ceFAZolin  (ANCEF ) IVPB 2g/100 mL premix        2 g 200 mL/hr over 30 Minutes Intravenous Every 6 hours 07/02/24 1455 07/02/24 2306   07/02/24 0830  ceFAZolin  (ANCEF ) IVPB 2g/100 mL premix        2 g 200 mL/hr over 30 Minutes Intravenous On call to O.R. 07/02/24 9177 07/02/24 1121        Patient was given sequential compression devices, early ambulation, and chemoprophylaxis to prevent DVT.  Patient benefited maximally from hospital stay and there were no complications.    Recent vital signs: No data found.   Recent laboratory studies: No results for input(s): WBC, HGB, HCT, PLT, NA, K, CL, CO2, BUN, CREATININE, GLUCOSE, INR, CALCIUM  in the last 72 hours.  Invalid input(s): PT, 2   Discharge  Medications:   Allergies as of 07/03/2024       Reactions   Codeine    passes out        Medication List     STOP taking these medications    naproxen sodium 220 MG tablet Commonly known as: ALEVE   Voltaren Arthritis Pain 1 % Gel Generic drug: diclofenac Sodium       TAKE these medications    acetaminophen  500 MG tablet Commonly known as: TYLENOL  Take 1,000 mg by mouth every 6 (six) hours as needed for moderate pain (pain score 4-6).   amLODipine  5 MG tablet Commonly known as: NORVASC  Take 5 mg by mouth daily.   Aspirin  Low Dose 81 MG chewable tablet Generic drug: aspirin  Chew 1 tablet (81 mg total) by mouth 2 (two) times daily for 21 days. Then take one 81 mg aspirin  once a day for three weeks. Then discontinue aspirin .   atorvastatin  20 MG tablet Commonly known as: LIPITOR Take 20 mg by mouth at bedtime.   hydrochlorothiazide  25 MG tablet Commonly known as: HYDRODIURIL  Take 25 mg by mouth daily.   methocarbamol  500 MG tablet Commonly known as: ROBAXIN  Take 1 tablet (500 mg total) by mouth every 6 (six) hours as needed for muscle spasms.   metoprolol  succinate 100 MG 24 hr tablet Commonly known as: TOPROL -XL Take 100 mg by mouth daily. Take with or immediately following a meal.   ondansetron  4 MG tablet Commonly known as: ZOFRAN  Take 1 tablet (4 mg total) by mouth every  6 (six) hours as needed for nausea.   oxyCODONE  5 MG immediate release tablet Commonly known as: Oxy IR/ROXICODONE  Take 1-2 tablets (5-10 mg total) by mouth every 6 (six) hours as needed for severe pain (pain score 7-10).   traMADol  50 MG tablet Commonly known as: ULTRAM  Take 1-2 tablets (50-100 mg total) by mouth every 6 (six) hours as needed for moderate pain (pain score 4-6).   Vitamin D3 50 MCG (2000 UT) capsule Take 2,000 Units by mouth daily.               Discharge Care Instructions  (From admission, onward)           Start     Ordered   07/03/24 0000  Weight  bearing as tolerated        07/03/24 0855   07/03/24 0000  Change dressing       Comments: You may remove the bulky bandage (ACE wrap and gauze) two days after surgery. You will have an adhesive waterproof bandage underneath. Leave this in place until your first follow-up appointment.   07/03/24 0855            Diagnostic Studies: No results found.  Disposition: Discharge disposition: 01-Home or Self Care       Discharge Instructions     Call MD / Call 911   Complete by: As directed    If you experience chest pain or shortness of breath, CALL 911 and be transported to the hospital emergency room.  If you develope a fever above 101 F, pus (white drainage) or increased drainage or redness at the wound, or calf pain, call your surgeon's office.   Change dressing   Complete by: As directed    You may remove the bulky bandage (ACE wrap and gauze) two days after surgery. You will have an adhesive waterproof bandage underneath. Leave this in place until your first follow-up appointment.   Constipation Prevention   Complete by: As directed    Drink plenty of fluids.  Prune juice may be helpful.  You may use a stool softener, such as Colace (over the counter) 100 mg twice a day.  Use MiraLax  (over the counter) for constipation as needed.   Diet - low sodium heart healthy   Complete by: As directed    Do not put a pillow under the knee. Place it under the heel.   Complete by: As directed    Driving restrictions   Complete by: As directed    No driving for two weeks   Post-operative opioid taper instructions:   Complete by: As directed    POST-OPERATIVE OPIOID TAPER INSTRUCTIONS: It is important to wean off of your opioid medication as soon as possible. If you do not need pain medication after your surgery it is ok to stop day one. Opioids include: Codeine, Hydrocodone(Norco, Vicodin), Oxycodone (Percocet, oxycontin ) and hydromorphone  amongst others.  Long term and even short term  use of opiods can cause: Increased pain response Dependence Constipation Depression Respiratory depression And more.  Withdrawal symptoms can include Flu like symptoms Nausea, vomiting And more Techniques to manage these symptoms Hydrate well Eat regular healthy meals Stay active Use relaxation techniques(deep breathing, meditating, yoga) Do Not substitute Alcohol to help with tapering If you have been on opioids for less than two weeks and do not have pain than it is ok to stop all together.  Plan to wean off of opioids This plan should start within one week post op of  your joint replacement. Maintain the same interval or time between taking each dose and first decrease the dose.  Cut the total daily intake of opioids by one tablet each day Next start to increase the time between doses. The last dose that should be eliminated is the evening dose.      TED hose   Complete by: As directed    Use stockings (TED hose) for three weeks on both leg(s).  You may remove them at night for sleeping.   Weight bearing as tolerated   Complete by: As directed         Follow-up Information     Kristian Stabs, [place]. Go on 07/18/2024.   Specialty: Orthopedic Surgery Why: You are scheduled for a post op appointment on Wednesday 07/18/24 at 2:00pm Contact information: [address] [place] [address] [phone]         [facility] Go on 07/05/2024.   Why: You are scheduled for physical therapy Thursday 07/05/24 at 10:15am [address] Contact information: [address] [place] [address] [phone]                  Signed: Roxie Mess 07/10/2024, 12:34 PM

## 2024-11-01 ENCOUNTER — Other Ambulatory Visit

## 2024-11-05 ENCOUNTER — Other Ambulatory Visit: Payer: Self-pay | Admitting: Internal Medicine

## 2024-11-05 DIAGNOSIS — E041 Nontoxic single thyroid nodule: Secondary | ICD-10-CM

## 2024-11-14 ENCOUNTER — Ambulatory Visit
Admission: RE | Admit: 2024-11-14 | Discharge: 2024-11-14 | Disposition: A | Discharge status: Home / Self Care | Source: Ambulatory Visit | Attending: Internal Medicine | Admitting: Internal Medicine

## 2024-11-14 DIAGNOSIS — E041 Nontoxic single thyroid nodule: Secondary | ICD-10-CM
